# Patient Record
Sex: Male | Born: 1970 | Race: Asian | Hispanic: No | Marital: Married | Smoking: Never smoker
Health system: Southern US, Community
[De-identification: ages and names within clinical notes are randomized; demographics above are authoritative.]

---

## 2012-12-06 ENCOUNTER — Ambulatory Visit (INDEPENDENT_AMBULATORY_CARE_PROVIDER_SITE_OTHER): Payer: BC Managed Care – PPO | Admitting: Internal Medicine

## 2012-12-06 VITALS — BP 116/72 | HR 73 | Temp 98.2°F | Resp 18 | Ht 66.5 in | Wt 178.0 lb

## 2012-12-06 DIAGNOSIS — S86111A Strain of other muscle(s) and tendon(s) of posterior muscle group at lower leg level, right leg, initial encounter: Secondary | ICD-10-CM

## 2012-12-06 DIAGNOSIS — M79661 Pain in right lower leg: Secondary | ICD-10-CM

## 2012-12-06 DIAGNOSIS — M79609 Pain in unspecified limb: Secondary | ICD-10-CM

## 2012-12-06 DIAGNOSIS — S838X9A Sprain of other specified parts of unspecified knee, initial encounter: Secondary | ICD-10-CM

## 2012-12-06 MED ORDER — IBUPROFEN 800 MG PO TABS: 800.0000 mg | ORAL_TABLET | Freq: Three times a day (TID) | ORAL | Status: DC | PRN

## 2012-12-06 NOTE — Patient Instructions (Signed)
Recommend ice for 30 minutes 2-3 times daily Elevate as much as possible Take ibuprofen 800 mg three times daily as needed for pain Return immediately or go to ER if you feel numbness, tingling, or lose sensation in your foot

## 2012-12-06 NOTE — Progress Notes (Signed)
  Subjective:    Patient ID: Erik Porter, male    DOB: February 22, 1970, 42 y.o.   MRN: [id number]  HPI 42 year old male presents for evaluation of right leg pain after an injury this morning while playing in a bad mitten tournament.  States it was cold outside this morning and he did not warm up as much as he normally does. During the first play of the match he made a sudden move and felt and pop and since then has been unable to walk without pain. He was not able to complete the tournament so he came home, soaked his leg in salt water. This did not seem to help his sx's so he came in for evaluation.  No hx of prior injury or surgeries to this leg. Is able to move his foot. Denies paresthesias or weakness.  There is swelling of his calf but no bruising or other skin changes.  He has not taken any OTC medications for this yet.   Patient is otherwise heathy with no other concerns today.     Review of Systems  Gastrointestinal: Negative for nausea and vomiting.  Musculoskeletal: Positive for gait problem (limping) and myalgias.  Skin: Negative for color change and wound.  Neurological: Negative for dizziness.       Objective:   Physical Exam  Constitutional: He is oriented to person, place, and time. He appears well-developed and well-nourished.  HENT:  Head: Normocephalic and atraumatic.  Right Ear: External ear normal.  Left Ear: External ear normal.  Eyes: Conjunctivae are normal.  Neck: Normal range of motion.  Cardiovascular: Normal rate.   Pulmonary/Chest: Effort normal.  Musculoskeletal:       Right lower leg: He exhibits tenderness (center of gastrocnemius +TTP ) and swelling. He exhibits no bony tenderness.  Calf muscle right>left. Normal sensation and distal pulses.  Negative Thompson test.   Neurological: He is alert and oriented to person, place, and time.  Psychiatric: He has a normal mood and affect. His behavior is normal. Judgment and thought content normal.           Assessment & Plan:  Calf pain, right  Crutches for ambulation.  Ice for 30 minutes 2-3 times daily. Elevate as much as possible.  RTC precautions discussed. Signs/symptoms of compartment syndrome also mentioned.  Desk duty only until follow up in 2 weeks. At that time will determine if ok to ease back into activity or if PT is needed. Ok to refer to PT via telephone call update    I have completed the patient encounter in its entirety as documented by [facility], with editing by me where necessary. Robert P. Merla Riches, M.D.

## 2012-12-20 ENCOUNTER — Ambulatory Visit (INDEPENDENT_AMBULATORY_CARE_PROVIDER_SITE_OTHER): Payer: BC Managed Care – PPO | Admitting: Physician Assistant

## 2012-12-20 VITALS — BP 94/68 | HR 72 | Temp 98.7°F | Resp 18 | Ht 66.0 in | Wt 173.2 lb

## 2012-12-20 DIAGNOSIS — M79609 Pain in unspecified limb: Secondary | ICD-10-CM

## 2012-12-20 DIAGNOSIS — M79661 Pain in right lower leg: Secondary | ICD-10-CM

## 2012-12-20 NOTE — Progress Notes (Signed)
  Subjective:    Patient ID: Erik Porter, male    DOB: [birth year], 42 y.o.   MRN: [id number]  HPI 42 year old male presents for recheck of right calf pain. DOI 12/06/12 while playing badmitten.  Has been using crutches until 2 days ago.  Is tolerating weight bearing ok - still limping slightly but has returned to normal duties at work.  Continues to have pain if he is on his feet for too long. Has not taken any pain medication. Is interested in PT to help him get back to his normal level of activity.   Patient is otherwise doing well with no other concerns today.     Review of Systems  Constitutional: Negative for fever and chills.  Musculoskeletal: Positive for gait problem.  Skin: Negative for rash and wound.  Neurological: Negative for weakness and numbness.       Objective:   Physical Exam  Constitutional: He is oriented to person, place, and time. He appears well-developed and well-nourished.  HENT:  Head: Normocephalic and atraumatic.  Right Ear: External ear normal.  Left Ear: External ear normal.  Eyes: Conjunctivae are normal.  Neck: Normal range of motion.  Cardiovascular: Normal rate.   Pulmonary/Chest: Effort normal.  Musculoskeletal:       Right ankle: He exhibits normal range of motion. Achilles tendon normal. Achilles tendon exhibits normal Thompson's test results.       Right lower leg: He exhibits swelling (still slight STS and minimal pain to palpation). He exhibits no bony tenderness, no edema and no deformity.  Neurological: He is alert and oriented to person, place, and time.  Psychiatric: He has a normal mood and affect. His behavior is normal. Judgment and thought content normal.          Assessment & Plan:  Calf pain, right - Plan: Ambulatory referral to Physical Therapy  Will refer to PT for evaluation and treatment Follow up here if symptoms worsening or as needed.

## 2012-12-27 ENCOUNTER — Telehealth: Payer: Self-pay

## 2012-12-27 NOTE — Telephone Encounter (Signed)
Pt need doctor note for work, for last week visit

## 2012-12-29 NOTE — Telephone Encounter (Signed)
Called and left message on [phone] for patient to return call to give [place] more info in regards to this and what days specifically were needed.

## 2013-01-02 NOTE — Telephone Encounter (Signed)
Left a message for patient to return call.

## 2013-11-04 ENCOUNTER — Ambulatory Visit (INDEPENDENT_AMBULATORY_CARE_PROVIDER_SITE_OTHER): Payer: Managed Care, Other (non HMO) | Admitting: Emergency Medicine

## 2013-11-04 ENCOUNTER — Ambulatory Visit (INDEPENDENT_AMBULATORY_CARE_PROVIDER_SITE_OTHER): Payer: Managed Care, Other (non HMO)

## 2013-11-04 VITALS — BP 108/62 | HR 70 | Temp 98.3°F | Resp 16 | Ht 66.0 in | Wt 173.0 lb

## 2013-11-04 DIAGNOSIS — M25561 Pain in right knee: Secondary | ICD-10-CM

## 2013-11-04 DIAGNOSIS — S86811A Strain of other muscle(s) and tendon(s) at lower leg level, right leg, initial encounter: Secondary | ICD-10-CM

## 2013-11-04 DIAGNOSIS — S86911A Strain of unspecified muscle(s) and tendon(s) at lower leg level, right leg, initial encounter: Secondary | ICD-10-CM

## 2013-11-04 MED ORDER — NAPROXEN SODIUM 550 MG PO TABS
550.0000 mg | ORAL_TABLET | Freq: Two times a day (BID) | ORAL | Status: AC
Start: 2013-11-04 — End: 2014-11-04

## 2013-11-04 NOTE — Patient Instructions (Signed)

## 2013-11-04 NOTE — Progress Notes (Signed)
[facility] [address], [place] [phone] [phone]  Date:  11/04/2013   Name:  Erik LevinsMichel BMWUXLKGMWNUUVNhouyvanisvong   DOB:  02-01-1970   MRN:  [id number]  PCP:  No PCP Per Patient    Chief Complaint: Knee Pain   History of Present Illness:  Erik DarnerMichel Porter is a 43 y.o. very pleasant male patient who presents with the following:  Has pain in right knee over past week since playing competitive badminton.  No discrete injury Has increased pain with kneeling and bending.  Left work early today due to pain Some mild effusion. No redness. No improvement with over the counter medications or other home remedies.  Denies other complaint or health concern today.   There are no active problems to display for this patient.   History reviewed. No pertinent past medical history.  History reviewed. No pertinent past surgical history.  History  Substance Use Topics  . Smoking status: Never Smoker   . Smokeless tobacco: Never Used  . Alcohol Use: No    Family History  Problem Relation Age of Onset  . Hyperlipidemia Father     Allergies  Allergen Reactions  . Asa [Aspirin]   . Penicillins     Medication list has been reviewed and updated.  Current Outpatient Prescriptions on File Prior to Visit  Medication Sig Dispense Refill  . ibuprofen (ADVIL,MOTRIN) 800 MG tablet Take 1 tablet (800 mg total) by mouth every 8 (eight) hours as needed.  90 tablet  1   No current facility-administered medications on file prior to visit.    Review of Systems:  As per HPI, otherwise negative.    Physical Examination: Filed Vitals:   11/04/13 1809  BP: 108/62  Pulse: 70  Temp: 98.3 F (36.8 C)  Resp: 16   Filed Vitals:   11/04/13 1809  Height: 5\' 6"  (1.676 m)  Weight: 173 lb (78.472 kg)   Body mass index is 27.94 kg/(m^2). Ideal Body Weight: Weight in (lb) to have BMI = 25: 154.6   GEN: WDWN, NAD, Non-toxic, Alert & Oriented x 3 HEENT: Atraumatic,  Normocephalic.  Ears and Nose: No external deformity. EXTR: No clubbing/cyanosis/edema NEURO: Normal gait.  PSYCH: Normally interactive. Conversant. Not depressed or anxious appearing.  Calm demeanor.  RIGHT knee: mild effusion.  Full AROM.  Joint stable.    Assessment and Plan: Knee strain Anaprox ICE  Signed,  Erik OdorJeffery Yarlin Breisch, MD   [facility] reading (PRIMARY) by  Dr. Dareen PianoAnderson.  Negative knee.

## 2015-05-19 IMAGING — CR DG KNEE COMPLETE 4+V*R*
3 series · 3 of 3 positions shown · non-contrast
Comparison: None.

CLINICAL DATA: Right knee injury 1 day ago with continued pain.

EXAM:
RIGHT KNEE - COMPLETE 4+ VIEW

[AP]
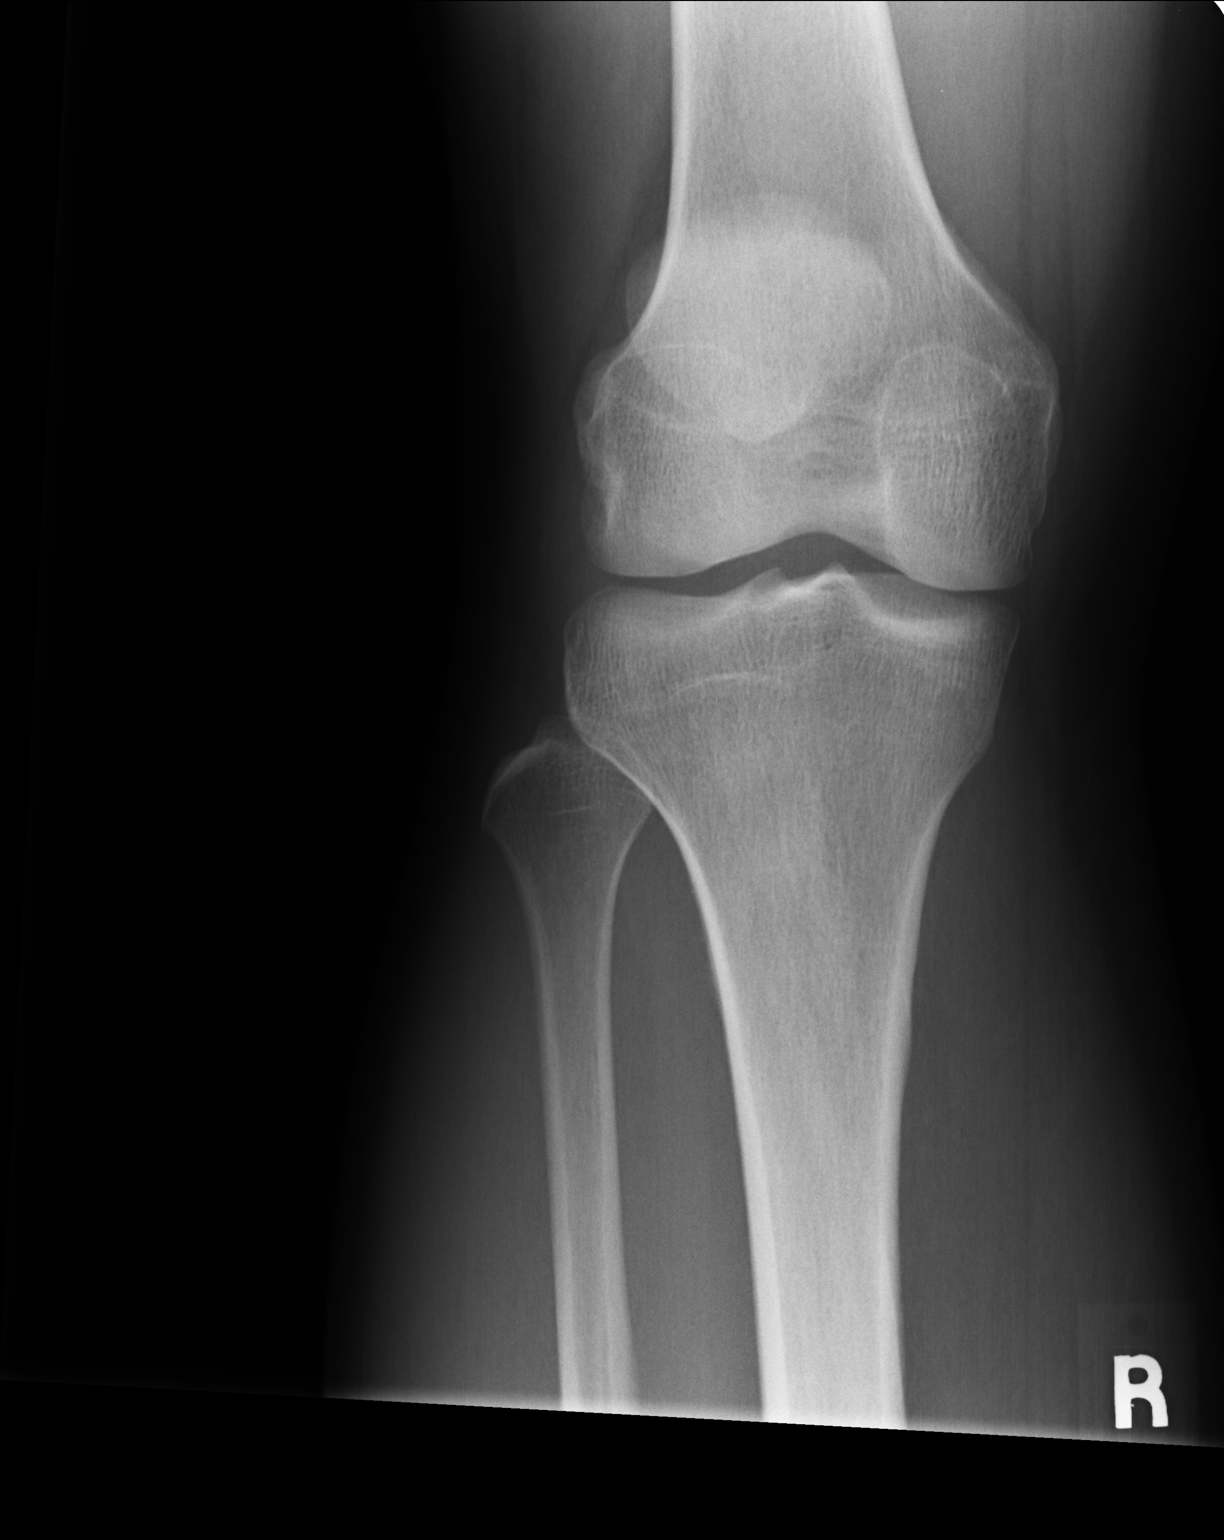

[ap int rot]
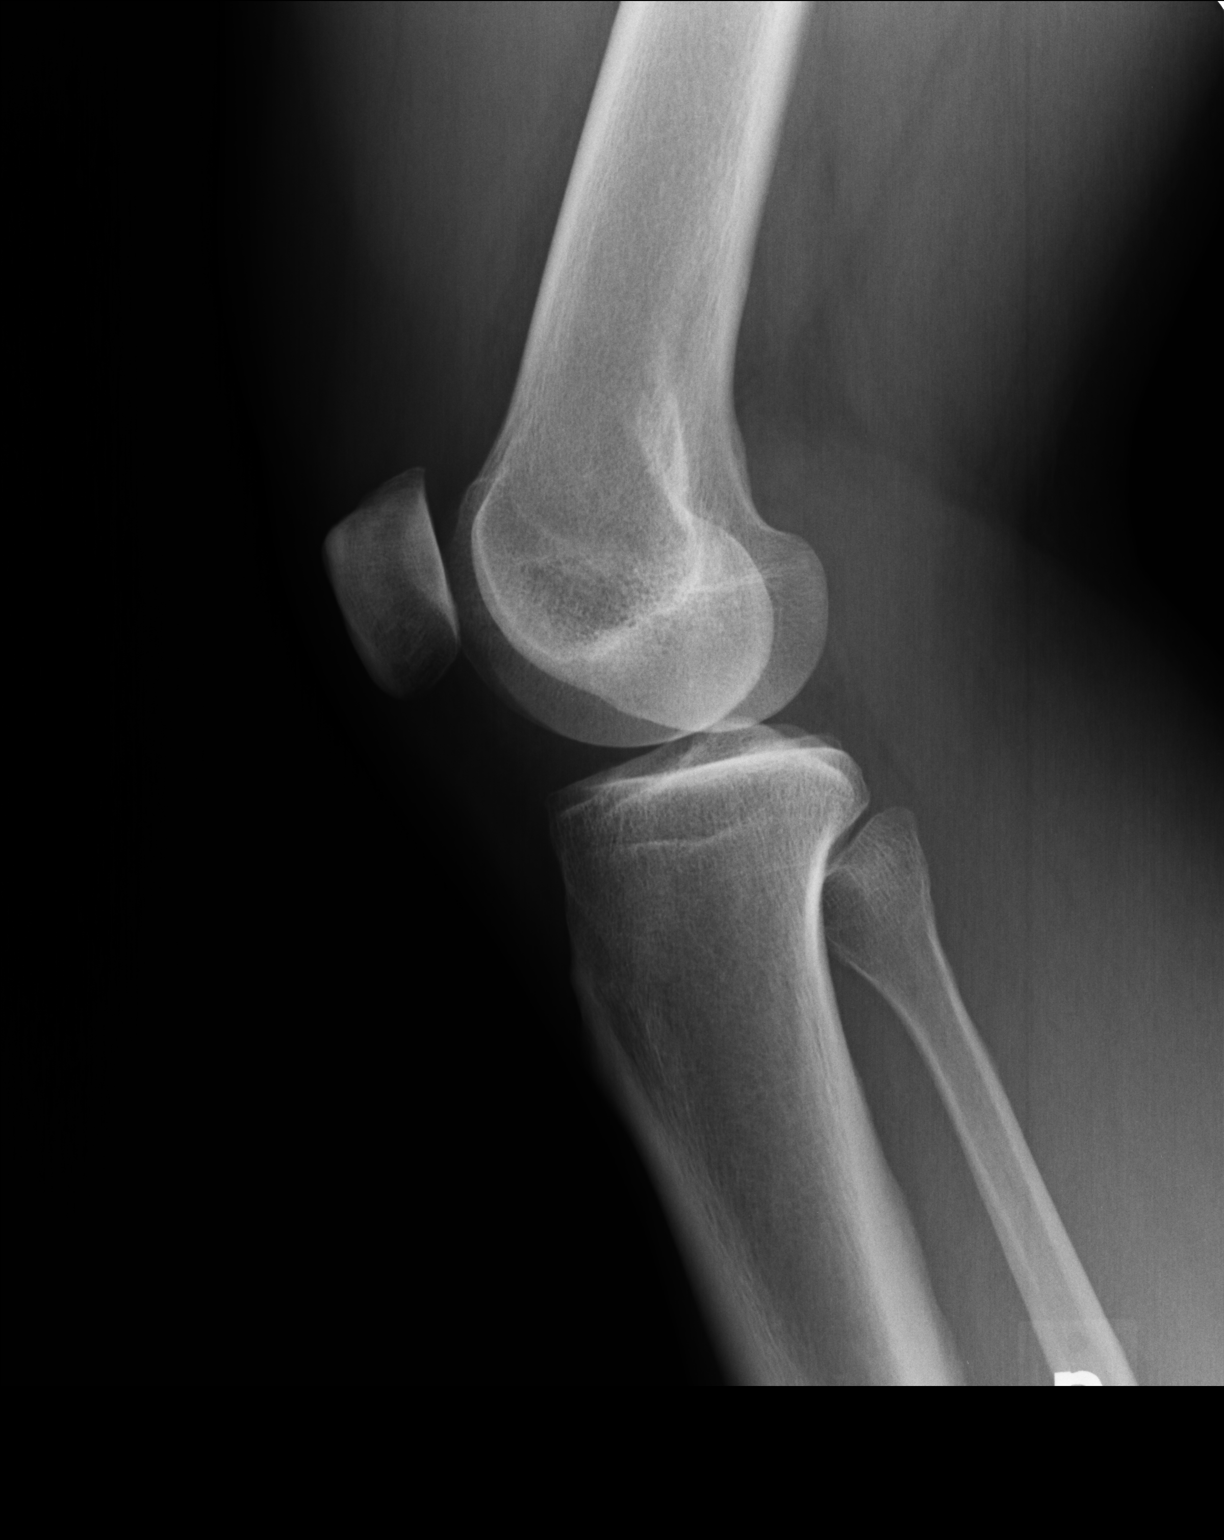

[lateral]
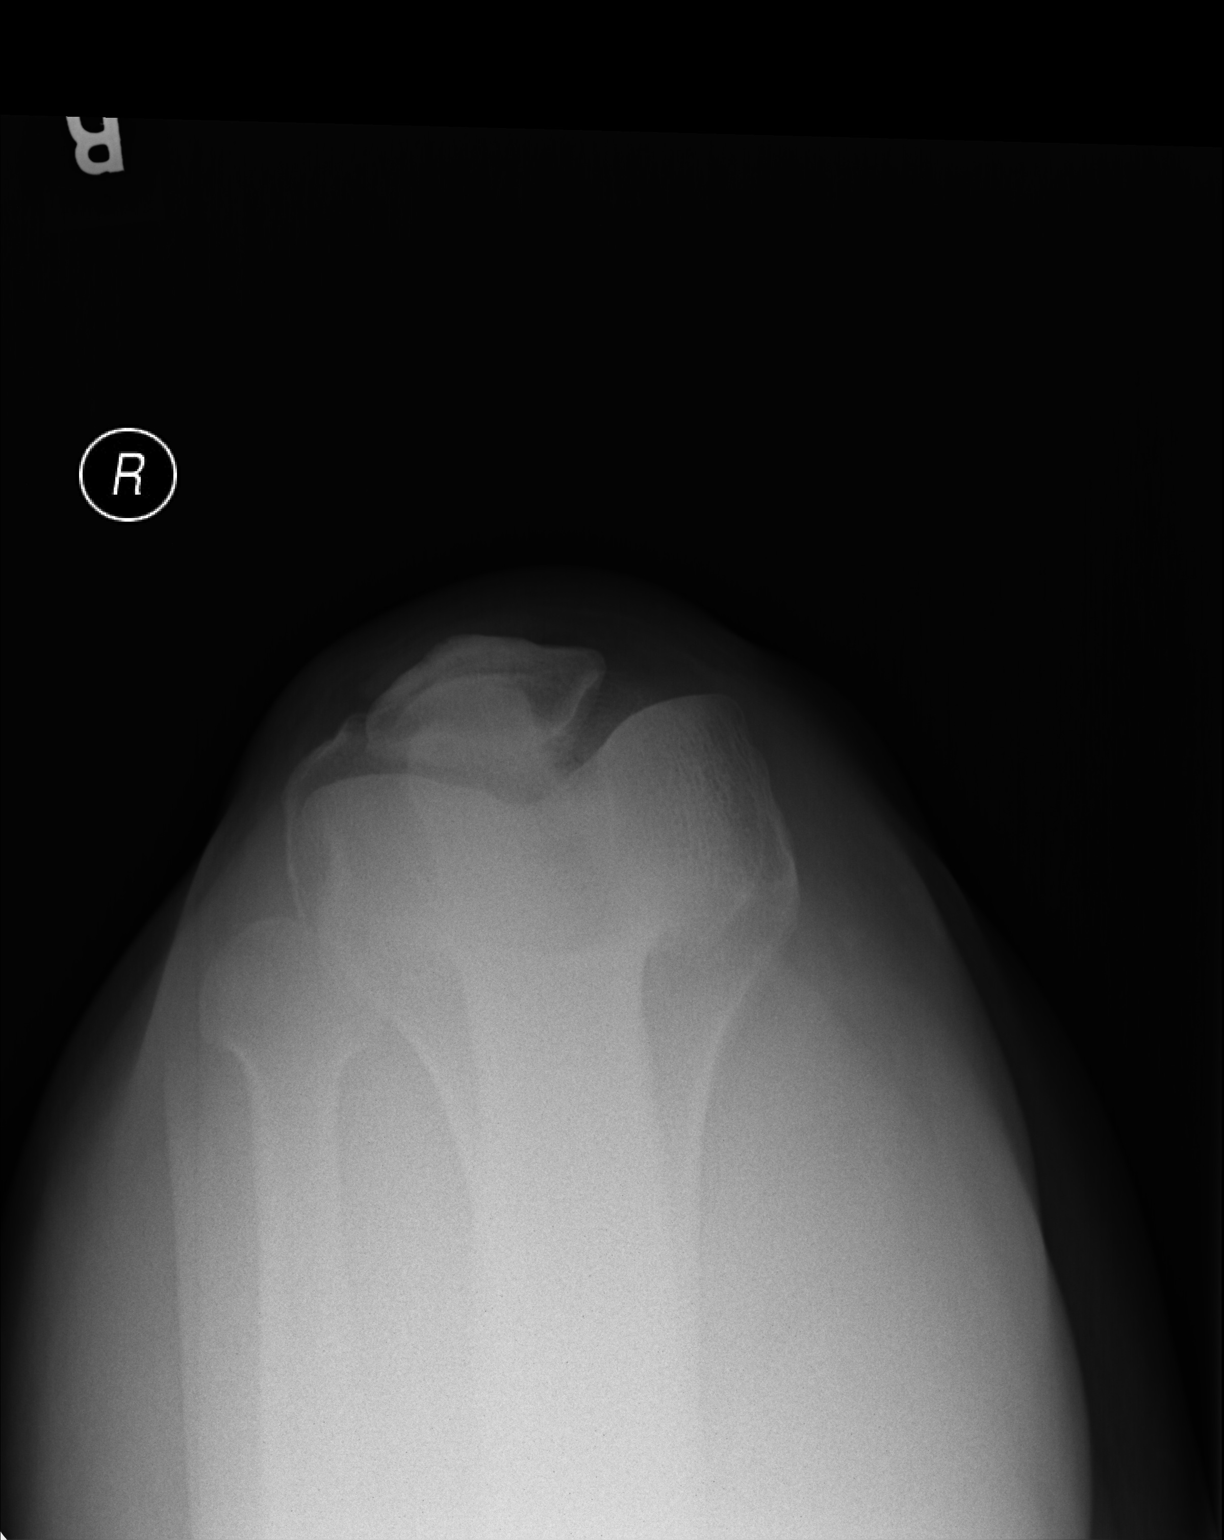

[3 of 3 positions shown; findings below may reference images not displayed]

FINDINGS: Imaged bones, joints and soft tissues appear normal.
IMPRESSION: Normal exam.

## 2018-03-31 ENCOUNTER — Ambulatory Visit (INDEPENDENT_AMBULATORY_CARE_PROVIDER_SITE_OTHER): Payer: Managed Care, Other (non HMO) | Admitting: Family Medicine

## 2018-03-31 ENCOUNTER — Encounter: Payer: Self-pay | Admitting: Family Medicine

## 2018-03-31 ENCOUNTER — Other Ambulatory Visit: Payer: Self-pay

## 2018-03-31 VITALS — BP 131/79 | HR 96 | Temp 98.8°F | Resp 18 | Ht 66.61 in | Wt 191.2 lb

## 2018-03-31 DIAGNOSIS — Z13220 Encounter for screening for lipoid disorders: Secondary | ICD-10-CM | POA: Diagnosis not present

## 2018-03-31 DIAGNOSIS — Z0001 Encounter for general adult medical examination with abnormal findings: Secondary | ICD-10-CM

## 2018-03-31 DIAGNOSIS — Z6830 Body mass index (BMI) 30.0-30.9, adult: Secondary | ICD-10-CM

## 2018-03-31 NOTE — Patient Instructions (Addendum)

## 2018-03-31 NOTE — Progress Notes (Signed)
Patient ID: Erik Porter, male   DOB: 01/05/1971, 48 y.o.   MRN: [id number]  Chief Complaint  Patient presents with  . Annual Exam  . Establish Care    Subjective:  Erik Porter is a 48 y.o. male here for a health maintenance visit.  Patient is new pt  There are no active problems to display for this patient.   History reviewed. No pertinent past medical history.  History reviewed. No pertinent surgical history.   Outpatient Medications Prior to Visit  Medication Sig Dispense Refill  . ibuprofen (ADVIL,MOTRIN) 800 MG tablet Take 1 tablet (800 mg total) by mouth every 8 (eight) hours as needed. (Patient not taking: Reported on 03/31/2018) 90 tablet 1   No facility-administered medications prior to visit.     Allergies  Allergen Reactions  . Asa [Aspirin]   . Penicillins      Family History  Problem Relation Age of Onset  . Hyperlipidemia Father      Health Habits: Dental Exam: up to date Eye Exam: up to date Exercise: 2 times/week on average Current exercise activities: [place] Diet: balanced  Social History   Socioeconomic History  . Marital status: Married    Spouse name: Not on file  . Number of children: 3  . Years of education: Not on file  . Highest education level: Not on file  Occupational History  . Not on file  Social Needs  . Financial resource strain: Not on file  . Food insecurity:    Worry: Not on file    Inability: Not on file  . Transportation needs:    Medical: Not on file    Non-medical: Not on file  Tobacco Use  . Smoking status: Never Smoker  . Smokeless tobacco: Never Used  Substance and Sexual Activity  . Alcohol use: No  . Drug use: No  . Sexual activity: Yes  Lifestyle  . Physical activity:    Days per week: Not on file    Minutes per session: Not on file  . Stress: Not on file  Relationships  . Social connections:    Talks on phone: Not on file    Gets together: Not on file    Attends religious  service: Not on file    Active member of club or organization: Not on file    Attends meetings of clubs or organizations: Not on file    Relationship status: Not on file  . Intimate partner violence:    Fear of current or ex partner: Not on file    Emotionally abused: Not on file    Physically abused: Not on file    Forced sexual activity: Not on file  Other Topics Concern  . Not on file  Social History Narrative  . Not on file   Social History   Substance and Sexual Activity  Alcohol Use No   Social History   Tobacco Use  Smoking Status Never Smoker  Smokeless Tobacco Never Used   Social History   Substance and Sexual Activity  Drug Use No     Health Maintenance: See under health Maintenance activity for review of completion dates as well.  There is no immunization history on file for this patient.    Depression Screen-PHQ2/9 Depression screen [facility] 2/9 03/31/2018  Decreased Interest 0  Down, Depressed, Hopeless 0  PHQ - 2 Score 0       Depression Severity and Treatment Recommendations:  0-4= None  5-9= Mild / Treatment: Support, educate to  call if worse; return in one month  10-14= Moderate / Treatment: Support, watchful waiting; Antidepressant or Psycotherapy  15-19= Moderately severe / Treatment: Antidepressant OR Psychotherapy  >= 20 = Major depression, severe / Antidepressant AND Psychotherapy    Review of Systems   ROS  See HPI for ROS as well.  Review of Systems  Constitutional: Negative for activity change, appetite change, chills and fever.  HENT: Negative for congestion, nosebleeds, trouble swallowing and voice change.   Respiratory: Negative for cough, shortness of breath and wheezing.   Gastrointestinal: Negative for diarrhea, nausea and vomiting.  Genitourinary: Negative for difficulty urinating, dysuria, flank pain and hematuria.  Musculoskeletal: Negative for back pain, joint swelling and neck pain.  Neurological: Negative for  dizziness, speech difficulty, light-headedness and numbness.  See HPI. All other review of systems negative.    Objective:   Vitals:   03/31/18 1529  BP: 131/79  Pulse: 96  Resp: 18  Temp: 98.8 F (37.1 C)  TempSrc: Oral  SpO2: 96%  Weight: 191 lb 3.2 oz (86.7 kg)  Height: 5' 6.61" (1.692 m)    Body mass index is 30.29 kg/m.  Physical Exam  Physical Exam  Constitutional: Oriented to person, place, and time. Appears well-developed and well-nourished.  HENT:  Head: Normocephalic and atraumatic.  Eyes: Conjunctivae and EOM are normal.  Ears: normal TM, normal canal bilaterally Cardiovascular: Normal rate, regular rhythm, normal heart sounds and intact distal pulses.  No murmur heard. Pulmonary/Chest: Effort normal and breath sounds normal. No stridor. No respiratory distress. Has no wheezes.  Abdomen: normoactive bs, soft, nontender, no rebound or guarding Neurological: Is alert and oriented to person, place, and time.  Skin: Skin is warm. Capillary refill takes less than 2 seconds.  Psychiatric: Has a normal mood and affect. Behavior is normal. Judgment and thought content normal.     Assessment/Plan:   Patient was seen for a health maintenance exam.  Counseled the patient on health maintenance issues. Reviewed her health mainteance schedule and ordered appropriate tests (see orders.) Counseled on regular exercise and weight management. Recommend regular eye exams and dental cleaning.   The following issues were addressed today for health maintenance:   Erik Porter was seen today for annual exam and establish care.  Diagnoses and all orders for this visit:  Encounter for general adult medical examination with abnormal findings-  Discussed age appropriate screenings  Screening for lipoid disorders-  Discussed lipid goals -     Lipid panel -     TSH  Body mass index (BMI) of 30.0-30.9 in adult -     Basic metabolic panel -     Hemoglobin A1c    Return in 1 year  (on 03/31/2019).    Body mass index is 30.29 kg/m.:  Discussed the patient's BMI with patient. The BMI body mass index is 30.29 kg/m.     No future appointments.  Patient Instructions       If you have lab work done today you will be contacted with your lab results within the next 2 weeks.  If you have not heard from [place] then please contact us. The fastest way to get your results is to register for My Chart.   IF you received an x-ray today, you will receive an invoice from [facility]. Please contact [facility] at [phone] with questions or concerns regarding your invoice.   IF you received labwork today, you will receive an invoice from [facility]. Please contact [facility] at [phone] with questions  or concerns regarding your invoice.   Our billing staff will not be able to assist you with questions regarding bills from these companies.  You will be contacted with the lab results as soon as they are available. The fastest way to get your results is to activate your My Chart account. Instructions are located on the last page of this paperwork. If you have not heard from [place] regarding the results in 2 weeks, please contact this office.      Health Maintenance, Male A healthy lifestyle and preventive care is important for your health and wellness. Ask your health care provider about what schedule of regular examinations is right for you. What should I know about weight and diet? Eat a Healthy Diet  Eat plenty of vegetables, fruits, whole grains, low-fat dairy products, and lean protein.  Do not eat a lot of foods high in solid fats, added sugars, or salt.  Maintain a Healthy Weight Regular exercise can help you achieve or maintain a healthy weight. You should:  Do at least 150 minutes of exercise each week. The exercise should increase your heart rate and make you sweat (moderate-intensity exercise).  Do strength-training exercises at least twice a  week. Watch Your Levels of Cholesterol and Blood Lipids  Have your blood tested for lipids and cholesterol every 5 years starting at 49 years of age. If you are at high risk for heart disease, you should start having your blood tested when you are 49 years old. You may need to have your cholesterol levels checked more often if: ? Your lipid or cholesterol levels are high. ? You are older than 49 years of age. ? You are at high risk for heart disease. What should I know about cancer screening? Many types of cancers can be detected early and may often be prevented. Lung Cancer  You should be screened every year for lung cancer if: ? You are a current smoker who has smoked for at least 30 years. ? You are a former smoker who has quit within the past 15 years.  Talk to your health care provider about your screening options, when you should start screening, and how often you should be screened. Colorectal Cancer  Routine colorectal cancer screening usually begins at 49 years of age and should be repeated every 5-10 years until you are 49 years old. You may need to be screened more often if early forms of precancerous polyps or small growths are found. Your health care provider may recommend screening at an earlier age if you have risk factors for colon cancer.  Your health care provider may recommend using home test kits to check for hidden blood in the stool.  A small camera at the end of a tube can be used to examine your colon (sigmoidoscopy or colonoscopy). This checks for the earliest forms of colorectal cancer. Prostate and Testicular Cancer  Depending on your age and overall health, your health care provider may do certain tests to screen for prostate and testicular cancer.  Talk to your health care provider about any symptoms or concerns you have about testicular or prostate cancer. Skin Cancer  Check your skin from head to toe regularly.  Tell your health care provider about any  new moles or changes in moles, especially if: ? There is a change in a mole's size, shape, or color. ? You have a mole that is larger than a pencil eraser.  Always use sunscreen. Apply sunscreen liberally and repeat throughout  the day.  Protect yourself by wearing long sleeves, pants, a wide-brimmed hat, and sunglasses when outside. What should I know about heart disease, diabetes, and high blood pressure?  If you are 4818-49 years of age, have your blood pressure checked every 3-5 years. If you are 49 years of age or older, have your blood pressure checked every year. You should have your blood pressure measured twice-once when you are at a hospital or clinic, and once when you are not at a hospital or clinic. Record the average of the two measurements. To check your blood pressure when you are not at a hospital or clinic, you can use: ? An automated blood pressure machine at a pharmacy. ? A home blood pressure monitor.  Talk to your health care provider about your target blood pressure.  If you are between 6045-49 years old, ask your health care provider if you should take aspirin to prevent heart disease.  Have regular diabetes screenings by checking your fasting blood sugar level. ? If you are at a normal weight and have a low risk for diabetes, have this test once every three years after the age of 49. ? If you are overweight and have a high risk for diabetes, consider being tested at a younger age or more often.  A one-time screening for abdominal aortic aneurysm (AAA) by ultrasound is recommended for men aged 65-75 years who are current or former smokers. What should I know about preventing infection? Hepatitis B If you have a higher risk for hepatitis B, you should be screened for this virus. Talk with your health care provider to find out if you are at risk for hepatitis B infection. Hepatitis C Blood testing is recommended for:  Everyone born from [id number] through 1966.  Anyone with  known risk factors for hepatitis C. Sexually Transmitted Diseases (STDs)  You should be screened each year for STDs including gonorrhea and chlamydia if: ? You are sexually active and are younger than 49 years of age. ? You are older than 49 years of age and your health care provider tells you that you are at risk for this type of infection. ? Your sexual activity has changed since you were last screened and you are at an increased risk for chlamydia or gonorrhea. Ask your health care provider if you are at risk.  Talk with your health care provider about whether you are at high risk of being infected with HIV. Your health care provider may recommend a prescription medicine to help prevent HIV infection. What else can I do?  Schedule regular health, dental, and eye exams.  Stay current with your vaccines (immunizations).  Do not use any tobacco products, such as cigarettes, chewing tobacco, and e-cigarettes. If you need help quitting, ask your health care provider.  Limit alcohol intake to no more than 2 drinks per day. One drink equals 12 ounces of beer, 5 ounces of wine, or 1 ounces of hard liquor.  Do not use street drugs.  Do not share needles.  Ask your health care provider for help if you need support or information about quitting drugs.  Tell your health care provider if you often feel depressed.  Tell your health care provider if you have ever been abused or do not feel safe at home. This information is not intended to replace advice given to you by your health care provider. Make sure you discuss any questions you have with your health care provider. Document Released: 07/14/2007 Document Revised:  09/14/2015 Document Reviewed: 10/19/2014 Elsevier Interactive Patient Education  [employer].

## 2018-04-01 LAB — BASIC METABOLIC PANEL
BUN/Creatinine Ratio: 13 (ref 9–20)
BUN: 14 mg/dL (ref 6–24)
CALCIUM: 9.8 mg/dL (ref 8.7–10.2)
CO2: 24 mmol/L (ref 20–29)
Chloride: 102 mmol/L (ref 96–106)
Creatinine, Ser: 1.12 mg/dL (ref 0.76–1.27)
GFR calc Af Amer: 89 mL/min/{1.73_m2} (ref >59)
GFR, EST NON AFRICAN AMERICAN: 77 mL/min/{1.73_m2} (ref >59)
Glucose: 99 mg/dL (ref 65–99)
Potassium: 4 mmol/L (ref 3.5–5.2)
Sodium: 140 mmol/L (ref 134–144)

## 2018-04-01 LAB — LIPID PANEL
CHOLESTEROL TOTAL: 231 mg/dL — AB (ref 100–199)
Chol/HDL Ratio: 5 ratio (ref 0.0–5.0)
HDL: 46 mg/dL (ref >39)
LDL Calculated: 153 mg/dL — ABNORMAL HIGH (ref 0–99)
TRIGLYCERIDES: 159 mg/dL — AB (ref 0–149)
VLDL Cholesterol Cal: 32 mg/dL (ref 5–40)

## 2018-04-01 LAB — TSH: TSH: 2.15 u[IU]/mL (ref 0.450–4.500)

## 2018-04-01 LAB — HEMOGLOBIN A1C
ESTIMATED AVERAGE GLUCOSE: 120 mg/dL
Hgb A1c MFr Bld: 5.8 % — ABNORMAL HIGH (ref 4.8–5.6)

## 2020-02-11 ENCOUNTER — Ambulatory Visit: Payer: Self-pay

## 2023-05-01 ENCOUNTER — Telehealth: Payer: Self-pay | Admitting: Family Medicine

## 2023-05-01 ENCOUNTER — Ambulatory Visit (INDEPENDENT_AMBULATORY_CARE_PROVIDER_SITE_OTHER): Admitting: Radiology

## 2023-05-01 ENCOUNTER — Encounter: Payer: Self-pay | Admitting: Emergency Medicine

## 2023-05-01 ENCOUNTER — Ambulatory Visit
Admission: EM | Admit: 2023-05-01 | Discharge: 2023-05-01 | Disposition: A | Discharge status: Home / Self Care | Attending: Internal Medicine | Admitting: Internal Medicine

## 2023-05-01 ENCOUNTER — Ambulatory Visit: Payer: Self-pay

## 2023-05-01 DIAGNOSIS — M19071 Primary osteoarthritis, right ankle and foot: Secondary | ICD-10-CM | POA: Diagnosis not present

## 2023-05-01 DIAGNOSIS — M79671 Pain in right foot: Secondary | ICD-10-CM | POA: Diagnosis not present

## 2023-05-01 DIAGNOSIS — M7989 Other specified soft tissue disorders: Secondary | ICD-10-CM | POA: Diagnosis not present

## 2023-05-01 DIAGNOSIS — M7731 Calcaneal spur, right foot: Secondary | ICD-10-CM | POA: Diagnosis not present

## 2023-05-01 NOTE — Discharge Instructions (Addendum)
 It is unclear what is causing the foot pain and swelling.  I have drawn a circle around the area of redness on your foot and would like for you to return to urgent care if the redness grows rapidly or if the area becomes warm to touch/more tender.   X-rays look great, no bony abnormalities.  Take tylenol as needed for pain.  Elevate the foot to relieve swelling.  Follow-up with [facility] as needed for ongoing evaluation.   If you develop any new or worsening symptoms or if your symptoms do not start to improve, please return here or follow-up with your primary care provider. If your symptoms are severe, please go to the emergency room.

## 2023-05-01 NOTE — ED Triage Notes (Addendum)
 Pt c/o right foot pain since yesterday. States he was driving back from [place] yesterday when foot began to hurt and has been very sore since. Foot is swollen, slightly red. Pain near middle toe and radiates from top to bottom of foot.     Pain yesterday 7/10 today 4/10 No ibuprofen or tylenol taken

## 2023-05-01 NOTE — Telephone Encounter (Signed)
 Noted. Pt scheduled With Family Medicine Garnette Gunner, MD) 06/10/2023 at 3:40 PM

## 2023-05-01 NOTE — ED Provider Notes (Addendum)
 [facility]    CSN: [id number] Arrival date & time: 05/01/23  1414      History   Chief Complaint Chief Complaint  Patient presents with   Foot Pain    HPI Erik Porter is a 53 y.o. Porter.   Erik Porter is a 53 y.o. Porter presenting for chief complaint of Foot Pain that started yesterday. Pain is located over the distal second and third metatarsals and started while he was driving home from [facility] yesterday. He was wearing tennis shoes during his drive and reports he started to feel the foot become more swollen inside of his shoe throughout the drive. Pain radiates through the foot from top to bottom and there is a small area of redness to the dorsum of the foot at site of pain. Denies recent trauma/injuries to the foot, numbness/tingling distally to pain/injury, and fevers/chills. No history of immunosuppression. The area of redness is not warm to touch and there are no puncture wounds or abrasions. Pain is triggered by weight bearing activity and fully resolves with rest/cessation of movement. He wonders if this could be due to tendonitis as he has had this in the past. He also states he suffered an injury to the same place 20 years ago when he was playing bad mitten more consistently and had to wear a custom insole for a while. No history of gout.  He has not attempted use of any OTC medicines to help with symptoms PTA.  Allergic to aspirin (caused fever to 110 degrees and severe headache per patient), he has taken ibuprofen in the past without reaction.    Foot Pain    History reviewed. No pertinent past medical history.  There are no active problems to display for this patient.   History reviewed. No pertinent surgical history.     Home Medications    Prior to Admission medications   Not on File    Family History Family History  Problem Relation Age of Onset   Hyperlipidemia Father     Social History Social History   Tobacco Use    Smoking status: Never   Smokeless tobacco: Never  Vaping Use   Vaping status: Never Used  Substance Use Topics   Alcohol use: No   Drug use: No     Allergies   Asa [aspirin] and Penicillins   Review of Systems Review of Systems Per HPI  Physical Exam Triage Vital Signs ED Triage Vitals  Encounter Vitals Group     BP 05/01/23 1427 121/82     Systolic BP Percentile --      Diastolic BP Percentile --      Pulse Rate 05/01/23 1427 80     Resp 05/01/23 1427 16     Temp 05/01/23 1427 97.8 F (Erik.6 C)     Temp Source 05/01/23 1427 Oral     SpO2 05/01/23 1427 93 %     Weight --      Height --      Head Circumference --      Peak Flow --      Pain Score 05/01/23 1429 4     Pain Loc --      Pain Education --      Exclude from Growth Chart --    No data found.  Updated Vital Signs BP 121/82 (BP Location: Right Arm)   Pulse 80   Temp 97.8 F (Erik.6 C) (Oral)   Resp 16   SpO2 93%   Visual  Acuity Right Eye Distance:   Left Eye Distance:   Bilateral Distance:    Right Eye Near:   Left Eye Near:    Bilateral Near:     Physical Exam Vitals and nursing note reviewed.  Constitutional:      Appearance: He is not ill-appearing or toxic-appearing.  HENT:     Head: Normocephalic and atraumatic.     Right Ear: Hearing and external ear normal.     Left Ear: Hearing and external ear normal.     Nose: Nose normal.     Mouth/Throat:     Lips: Pink.  Eyes:     General: Lids are normal. Vision grossly intact. Gaze aligned appropriately.     Extraocular Movements: Extraocular movements intact.     Conjunctiva/sclera: Conjunctivae normal.  Pulmonary:     Effort: Pulmonary effort is normal.  Musculoskeletal:     Cervical back: Neck supple.     Right foot: Normal range of motion and normal capillary refill. Tenderness present. No swelling, deformity, bunion, Charcot foot, foot drop, prominent metatarsal heads, laceration, bony tenderness or crepitus. Normal pulse.     Left  foot: Normal.       Feet:     Comments: +2 bilateral dorsalis pedis pulses. Normal ROM of the right foot. Tender to palpation over the distal second and third metatarsals. 3cm in diameter area of erythema overlying area of tenderness. No lacerations/abrasions/wounds. No interdigital lesions. Ambulatory with steady gait without limp.   Skin:    General: Skin is warm and dry.     Capillary Refill: Capillary refill takes less than 2 seconds.     Findings: No rash.  Neurological:     General: No focal deficit present.     Mental Status: He is alert and oriented to person, place, and time. Mental status is at baseline.     Cranial Nerves: No dysarthria or facial asymmetry.  Psychiatric:        Mood and Affect: Mood normal.        Speech: Speech normal.        Behavior: Behavior normal.        Thought Content: Thought content normal.        Judgment: Judgment normal.      UC Treatments / Results  Labs (all labs ordered are listed, but only abnormal results are displayed) Labs Reviewed - No data to display  EKG   Radiology No results found.  Procedures Procedures (including critical care time)  Medications Ordered in UC Medications - No data to display  Initial Impression / Assessment and Plan / UC Course  I have reviewed the triage vital signs and the nursing notes.  Pertinent labs & imaging results that were available during my care of the patient were reviewed by me and considered in my medical decision making (see chart for details).   1. Acute right foot pain Unclear etiology of patient's right foot pain and swelling/redness. This could be an early presentation of developing cellulitis, though exam is not very convincing today as there is no associated warmth to redness. No lymphangitis. Minimally tender to palpation, mostly tender with weightbearing activity and movement. Low suspicion for gouty arthritis.   X-ray of right foot is unremarkable for signs of acute bony  abnormality, radiology re-read pending at time of discharge, staff will call if re-read indicates need for change in treatment plan.  For now, we will treat with antiinflammatories for tendinitis, RICE, and have patient follow-up with [facility] for ongoing evaluation.  Ibuprofen 600mg  every 6 hours as needed for pain/swelling with food.   Advised to return to urgent care if redness/swelling worsen in the next few days. Skin pen used to outline area of erythema.   Counseled patient on potential for adverse effects with medications prescribed/recommended today, strict ER and return-to-clinic precautions discussed, patient verbalized understanding.    Final Clinical Impressions(s) / UC Diagnoses   Final diagnoses:  Acute foot pain, right     Discharge Instructions      It is unclear what is causing the foot pain and swelling.  I have drawn a circle around the area of redness on your foot and would like for you to return to urgent care if the redness grows rapidly or if the area becomes warm to touch/more tender.   X-rays look great, no bony abnormalities.  Take tylenol as needed for pain.  Elevate the foot to relieve swelling.  Follow-up with [facility] as needed for ongoing evaluation.   If you develop any new or worsening symptoms or if your symptoms do not start to improve, please return here or follow-up with your primary care provider. If your symptoms are severe, please go to the emergency room.     ED Prescriptions   None    PDMP not reviewed this encounter.     Carlisle Beers, [place] 05/01/23 [phone]

## 2023-05-01 NOTE — Telephone Encounter (Signed)
 Copied from CRM [id number]. Topic: Clinical - Red Word Triage >> May 01, 2023  9:04 AM Sasha H wrote: [facility] that prompted transfer to Nurse Triage: PT states he started having discomfort in right foot last night while traveling, with waking up this morning and barely being able to bare weight on it.   Chief Complaint: Right Foot Symptoms: Pain, Swelling  Frequency: Acute  Pertinent Negatives: Patient denies fever, discoloration, numbness, weakness  Disposition: [] ED /[x] Urgent Care (no appt availability in office) / [x] Appointment(In office/virtual)/ []  [facility]/ [] Home Care/ [] Refused Recommended Disposition /[]  Mobile Bus/ []  Follow-up with PCP Additional Notes: MN is being triaged for pain in his right foot. The patient is without any numbness, tingling, or discoloration. The patient complains of severe pain when bearing weight on his right foot. Referred patient to the urgent care for prompt evaluation and treatment, made a new patient appointment at the patient's office of choice.   Reason for Disposition  [1] SEVERE pain (e.g., excruciating, unable to do any normal activities) AND [2] not improved after 2 hours of pain medicine  Answer Assessment - Initial Assessment Questions 1. ONSET: "When did the pain start?"      Acute, since last nigh  2. LOCATION: "Where is the pain located?"      Right Foot, Top of foot  3. PAIN: "How bad is the pain?"    (Scale 1-10; or mild, moderate, severe)  - MILD (1-3): doesn't interfere with normal activities.   - MODERATE (4-7): interferes with normal activities (e.g., work or school) or awakens from sleep, limping.   - SEVERE (8-10): excruciating pain, unable to do any normal activities, unable to walk.      7  4. WORK OR EXERCISE: "Has there been any recent work or exercise that involved this part of the body?"      No, only traveled for an extended period of time.  5. CAUSE: "What do you think is causing the foot  pain?"     Unsure  6. OTHER SYMPTOMS: "Do you have any other symptoms?" (e.g., leg pain, rash, fever, numbness)     Swelling  Protocols used: Foot Pain-A-AH

## 2023-05-04 ENCOUNTER — Ambulatory Visit: Admitting: Podiatry

## 2023-05-06 ENCOUNTER — Ambulatory Visit (INDEPENDENT_AMBULATORY_CARE_PROVIDER_SITE_OTHER)

## 2023-05-06 ENCOUNTER — Ambulatory Visit (INDEPENDENT_AMBULATORY_CARE_PROVIDER_SITE_OTHER): Admitting: Podiatry

## 2023-05-06 DIAGNOSIS — M79671 Pain in right foot: Secondary | ICD-10-CM

## 2023-05-06 DIAGNOSIS — M778 Other enthesopathies, not elsewhere classified: Secondary | ICD-10-CM

## 2023-05-08 NOTE — Progress Notes (Signed)
  Subjective:  Patient ID: Erik Porter, male    DOB: 1970/04/03,  MRN: [id number]  Chief Complaint  Patient presents with   Foot Pain    RM#11 Some discomfort with movement no injuries started on Tuesday night while traveling.    Discussed the use of AI scribe software for clinical note transcription with the patient, who gave verbal consent to proceed.  History of Present Illness The patient, with a history of a small fracture on the same toe 25 years ago, presents with right foot pain that started on Tuesday evening while driving back from [place]. The discomfort was localized to the top of the second toe (MTPJ) and was associated with swelling, making his shoe feel tight. The pain worsened over time, to the point where he had to remove his shoe to continue driving. The pain was severe enough to disrupt his sleep and he was unable to put his foot on the floor the following morning. The pain was exacerbated by movement and felt like tendonitis. The patient denies any pain in the calf or leg and denies any other swelling. He sought care at an urgent care center where he was advised to take Tylenol for the pain due to an allergy to aspirin, which causes high fever. The patient reports that the pain has since resolved and denies any current discomfort.      Objective:    Physical Exam General: AAO x3, NAD  Dermatological: Skin is warm, dry and supple bilateral. There are no open sores, no preulcerative lesions, no rash or signs of infection present.  Vascular: Dorsalis Pedis artery and Posterior Tibial artery pedal pulses are 2/4 bilateral with immedate capillary fill time. There is no pain with calf compression, swelling, warmth, erythema.   Neruologic: Grossly intact via light touch bilateral.  Musculoskeletal: Unable to appreciate any area of pinpoint tenderness.  There is an area marked off on the right dorsal foot on the second MTPJ where he previously had some localized  erythema white, able to visualize today.  There is trace edema there is no erythema or warmth.  There is no skin breakdown.  Not able to appreciate any area pinpoint tenderness.  Gait: Unassisted, Nonantalgic.     No images are attached to the encounter.    Results RADIOLOGY Foot X-ray: No fractures, joints appear normal, without any evidence of acute fracture.     Assessment:   1. Capsulitis of right foot      Plan:  Patient was evaluated and treated and all questions answered.  Assessment and Plan Assessment & Plan Capsulitis of the second toe Acute pain and swelling resolved. Minimal tenderness remains. Likely capsulitis due to positional strain. No fracture or infection. Erythema likely from inflammation.  - Advise icing 20 minutes twice daily. - Recommend over-the-counter Voltaren gel. - Suggest Biofreeze for relief. - Instruct to monitor symptoms and contact office if recurrence occurs, especially during long drives.    Return if symptoms worsen or fail to improve.   Vivi Barrack DPM

## 2023-06-10 ENCOUNTER — Ambulatory Visit: Payer: Self-pay | Admitting: Family Medicine

## 2023-06-10 ENCOUNTER — Ambulatory Visit: Admitting: Family Medicine

## 2023-07-24 ENCOUNTER — Ambulatory Visit: Payer: Self-pay | Admitting: Family Medicine

## 2023-07-24 ENCOUNTER — Encounter: Payer: Self-pay | Admitting: Family Medicine

## 2023-07-24 ENCOUNTER — Ambulatory Visit: Admitting: Family Medicine

## 2023-07-24 VITALS — BP 116/74 | HR 67 | Temp 97.9°F | Ht 66.25 in | Wt 203.2 lb

## 2023-07-24 DIAGNOSIS — E6609 Other obesity due to excess calories: Secondary | ICD-10-CM

## 2023-07-24 DIAGNOSIS — Z1159 Encounter for screening for other viral diseases: Secondary | ICD-10-CM

## 2023-07-24 DIAGNOSIS — E66811 Obesity, class 1: Secondary | ICD-10-CM

## 2023-07-24 DIAGNOSIS — Z125 Encounter for screening for malignant neoplasm of prostate: Secondary | ICD-10-CM | POA: Diagnosis not present

## 2023-07-24 DIAGNOSIS — Z0000 Encounter for general adult medical examination without abnormal findings: Secondary | ICD-10-CM | POA: Diagnosis not present

## 2023-07-24 DIAGNOSIS — E782 Mixed hyperlipidemia: Secondary | ICD-10-CM

## 2023-07-24 DIAGNOSIS — Z7689 Persons encountering health services in other specified circumstances: Secondary | ICD-10-CM

## 2023-07-24 DIAGNOSIS — R7303 Prediabetes: Secondary | ICD-10-CM

## 2023-07-24 DIAGNOSIS — Z1211 Encounter for screening for malignant neoplasm of colon: Secondary | ICD-10-CM

## 2023-07-24 DIAGNOSIS — Z6832 Body mass index (BMI) 32.0-32.9, adult: Secondary | ICD-10-CM | POA: Diagnosis not present

## 2023-07-24 DIAGNOSIS — Z1212 Encounter for screening for malignant neoplasm of rectum: Secondary | ICD-10-CM | POA: Diagnosis not present

## 2023-07-24 DIAGNOSIS — R3121 Asymptomatic microscopic hematuria: Secondary | ICD-10-CM

## 2023-07-24 DIAGNOSIS — R7401 Elevation of levels of liver transaminase levels: Secondary | ICD-10-CM

## 2023-07-24 LAB — CBC WITH DIFFERENTIAL/PLATELET
Basophils Absolute: 0.1 10*3/uL (ref 0.0–0.1)
Basophils Relative: 1 % (ref 0.0–3.0)
Eosinophils Absolute: 0.3 10*3/uL (ref 0.0–0.7)
Eosinophils Relative: 6.6 % — ABNORMAL HIGH (ref 0.0–5.0)
HCT: 46 % (ref 39.0–52.0)
Hemoglobin: 15.5 g/dL (ref 13.0–17.0)
Lymphocytes Relative: 33.9 % (ref 12.0–46.0)
Lymphs Abs: 1.8 10*3/uL (ref 0.7–4.0)
MCHC: 33.6 g/dL (ref 30.0–36.0)
MCV: 90.2 fl (ref 78.0–100.0)
Monocytes Absolute: 0.4 10*3/uL (ref 0.1–1.0)
Monocytes Relative: 7.9 % (ref 3.0–12.0)
Neutro Abs: 2.6 10*3/uL (ref 1.4–7.7)
Neutrophils Relative %: 50.6 % (ref 43.0–77.0)
Platelets: 192 10*3/uL (ref 150.0–400.0)
RBC: 5.1 Mil/uL (ref 4.22–5.81)
RDW: 13.4 % (ref 11.5–15.5)
WBC: 5.2 10*3/uL (ref 4.0–10.5)

## 2023-07-24 LAB — COMPREHENSIVE METABOLIC PANEL WITH GFR
ALT: 56 U/L — ABNORMAL HIGH (ref 0–53)
AST: 27 U/L (ref 0–37)
Albumin: 4.7 g/dL (ref 3.5–5.2)
Alkaline Phosphatase: 71 U/L (ref 39–117)
BUN: 15 mg/dL (ref 6–23)
CO2: 29 meq/L (ref 19–32)
Calcium: 9.9 mg/dL (ref 8.4–10.5)
Chloride: 103 meq/L (ref 96–112)
Creatinine, Ser: 1.25 mg/dL (ref 0.40–1.50)
GFR: 65.62 mL/min (ref >60.00)
Glucose, Bld: 106 mg/dL — ABNORMAL HIGH (ref 70–99)
Potassium: 4 meq/L (ref 3.5–5.1)
Sodium: 140 meq/L (ref 135–145)
Total Bilirubin: 0.8 mg/dL (ref 0.2–1.2)
Total Protein: 7.8 g/dL (ref 6.0–8.3)

## 2023-07-24 LAB — LIPID PANEL
Cholesterol: 282 mg/dL — ABNORMAL HIGH (ref 0–200)
HDL: 45.6 mg/dL (ref >39.00)
LDL Cholesterol: 203 mg/dL — ABNORMAL HIGH (ref 0–99)
NonHDL: 236.42
Total CHOL/HDL Ratio: 6
Triglycerides: 165 mg/dL — ABNORMAL HIGH (ref 0.0–149.0)
VLDL: 33 mg/dL (ref 0.0–40.0)

## 2023-07-24 LAB — MICROALBUMIN / CREATININE URINE RATIO
Creatinine,U: 226 mg/dL
Microalb Creat Ratio: 6.6 mg/g (ref 0.0–30.0)
Microalb, Ur: 1.5 mg/dL (ref 0.0–1.9)

## 2023-07-24 LAB — HEMOGLOBIN A1C: Hgb A1c MFr Bld: 6.1 % (ref 4.6–6.5)

## 2023-07-24 LAB — PSA: PSA: 0.92 ng/mL (ref 0.10–4.00)

## 2023-07-24 LAB — TSH: TSH: 1.8 u[IU]/mL (ref 0.35–5.50)

## 2023-07-24 MED ORDER — ROSUVASTATIN CALCIUM 10 MG PO TABS: 10.0000 mg | ORAL_TABLET | Freq: Every day | ORAL | 3 refills | Status: DC

## 2023-07-24 NOTE — Progress Notes (Signed)
 Assessment  Assessment/Plan:  Assessment and Plan Assessment & Plan Prediabetes A1c of 5.9 as of March 2020 with class II obesity (BMI 32.5). No current symptoms. Lifestyle modifications recommended to manage weight and reduce diabetes risk. - Recheck hemoglobin A1c - Screen for diabetes with comprehensive metabolic panel - Recommend lifestyle modifications including diet and exercise  Obesity Class II obesity (BMI 32.5). No current symptoms. Lifestyle modifications recommended to manage weight and reduce risk of associated conditions. - Recommend lifestyle modifications including 150 minutes of moderate-level activity per week and resistive training two times a week - Recommend a healthy diet like the Mediterranean diet with whole grains, fresh fruits and vegetables, healthy fats, and lean proteins  Hyperlipidemia Hyperlipidemia since March 2020. No current symptoms. Blood pressure well-controlled at 116/74. Class II obesity (BMI 32.5). Family history of high cholesterol in father. Screening for thyroid dysfunction and hematologic disorders recommended. - Recheck lipid panel - Screen for thyroid dysfunction with TSH - Screen for hematologic disorders  General Health Maintenance Due for routine screenings and vaccinations. Uncertain hepatitis B vaccination status. Due for hepatitis B, C, and HIV screening, colon and prostate cancer screening. Eye and dental exams recommended. Colonoscopy preferred for colon cancer screening due to biopsy and treatment capabilities, reducing mortality. Screening for renal and liver dysfunction recommended. - Screen for hepatitis B, C, and HIV - Screen for renal and liver dysfunction with comprehensive metabolic panel and urinalysis with microscopic and reflex culture - Screen GFR and check microalbumin creatinine ratio - Order PSA for prostate cancer screening - Refer for colonoscopy for colon cancer screening - Check antibody levels for hepatitis B  and vaccinate if necessary - Recommend annual eye exams - Recommend dental exam  Follow-up Plans for lab work and specialist referral discussed. - Perform lab work today - Referral for colonoscopy - Follow-up with lab results via MyChart     There are no discontinued medications.  Patient Counseling(The following topics were reviewed and/or handout was given):  -Nutrition: Stressed importance of moderation in sodium/caffeine intake, saturated fat and cholesterol, caloric balance, sufficient intake of fresh fruits, vegetables, and fiber.  -Stressed the importance of regular exercise.   -Substance Abuse: Discussed cessation/primary prevention of tobacco, alcohol, or other drug use; driving or other dangerous activities under the influence; availability of treatment for abuse.   -Injury prevention: Discussed safety belts, safety helmets, smoke detector, smoking near bedding or upholstery.   -Sexuality: Discussed sexually transmitted diseases, partner selection, use of condoms, avoidance of unintended pregnancy and contraceptive alternatives.   -Dental health: Discussed importance of regular tooth brushing, flossing, and dental visits.  -Health maintenance and immunizations reviewed. Please refer to Health maintenance section.  No follow-ups on file.        Subjective:   Encounter date: 07/24/2023  Chief Complaint  Patient presents with   Establish Care    Fasting     Discussed the use of AI scribe software for clinical note transcription with the patient, who gave verbal consent to proceed.  History of Present Illness Erik Porter is a 53 year old male with hyperlipidemia and prediabetes who presents for establishing care and fasting.  He has a history of hyperlipidemia and prediabetes diagnosed in March 2020. He manages these conditions by limiting sugar intake despite having a 'sweet tooth' and fasts daily by skipping breakfast. He does not consume coffee, soda,  alcohol, or smoke.  His family history includes a paternal grandfather who died of a heart attack at age 3 and a  father with high cholesterol and a stomach ulcer. There is no other significant family history of heart disease.  Socially, he was originally from [place], raised in [place], and has been living in [place] for 17 years. He moved to [place] because of his wife and has three children aged 75, 82, and 7. 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 He engages in physical activities such as badminton and plans to increase this once his sports club opens.  He recalls receiving multiple vaccinations in 2012 before traveling to [place] but is unsure if he received the hepatitis B vaccine. He last saw an eye doctor two years ago and is due for an eye exam this summer. He has not seen a dentist in the past six months.  No chest pain, shortness of breath, or other symptoms. He mentions snoring only when very tired and denies any witnessed apneas or breathing difficulties during sleep.       07/24/2023   10:03 AM 03/31/2018    3:25 PM  Depression screen PHQ 2/9  Decreased Interest  0  Down, Depressed, Hopeless 0 0  PHQ - 2 Score 0 0  Altered sleeping 0   Tired, decreased energy 1   Change in appetite 0   Feeling bad or failure about yourself  0   Trouble concentrating 0   Moving slowly or fidgety/restless 0   Suicidal thoughts 0   PHQ-9 Score 1   Difficult doing work/chores Not difficult at all         No data to display          Health Maintenance Due  Topic Date Due   HIV Screening  Never done   Hepatitis C Screening  Never done   Hepatitis B Vaccines (1 of 3 - 19+ 3-dose series) Never done   Colonoscopy  Never done       PMH:  The following were reviewed and entered/updated in epic: No past medical history on file.  Patient Active Problem List   Diagnosis Date Noted   Prediabetes  07/24/2023   Mixed hyperlipidemia 07/24/2023   Encounter to establish care with new provider 07/24/2023    No past surgical history on file.  Family History  Problem Relation Age of Onset   Hyperlipidemia Father    Peptic Ulcer Disease Sister    Heart attack Paternal Grandfather     Medications- reviewed and updated No outpatient medications prior to visit.   No facility-administered medications prior to visit.    Allergies  Allergen Reactions   Asa [Aspirin]    Penicillins     Social History   Socioeconomic History   Marital status: Married    Spouse name: Not on file   Number of children: 3   Years of education: Not on file   Highest education level: Not on file  Occupational History   Not on file  Tobacco Use   Smoking status: Never   Smokeless tobacco: Never  Vaping Use   Vaping status: Never Used  Substance and Sexual Activity   Alcohol use: No   Drug use: No   Sexual activity: Yes  Other Topics Concern   Not on file  Social History Narrative   Not on file   Social Drivers of Health   Financial Resource Strain: Not on file  Food Insecurity: Not on file  Transportation Needs: Not on file  Physical Activity: Not on file  Stress: Not on file  Social Connections: Not on file           Objective:  Physical Exam: BP 116/74   Pulse 67   Temp 97.9 F (36.6 C) (Temporal)   Ht 5' 6.25 (1.683 m)   Wt 203 lb 3.2 oz (92.2 kg)   SpO2 97%   BMI 32.55 kg/m   Body mass index is 32.55 kg/m. Wt Readings from Last 3 Encounters:  07/24/23 203 lb 3.2 oz (92.2 kg)  03/31/18 191 lb 3.2 oz (86.7 kg)  11/04/13 173 lb (78.5 kg)    Physical Exam VITALS: BP- 116/74 MEASUREMENTS: BMI- 32.5. GENERAL: Alert, cooperative, well developed, no acute distress. HEENT: Normocephalic, normal oropharynx, moist mucous membranes, right ear with cerumen not significant, left ear normal. CHEST: Clear to auscultation bilaterally, no wheezes, rhonchi, or  crackles. CARDIOVASCULAR: Normal heart rate and rhythm, S1 and S2 normal without murmurs. ABDOMEN: Soft, non-tender, non-distended, without organomegaly, normal bowel sounds. EXTREMITIES: No cyanosis or edema, no leg swelling. MUSCULOSKELETAL: Musculoskeletal system normal. NEUROLOGICAL: Cranial nerves grossly intact, moves all extremities without gross motor or sensory deficit.  Physical Exam Constitutional:      General: He is not in acute distress.    Appearance: Normal appearance. He is not ill-appearing or toxic-appearing.  HENT:     Head: Normocephalic and atraumatic.     Right Ear: Hearing, tympanic membrane, ear canal and external ear normal. There is no impacted cerumen.     Left Ear: Hearing, tympanic membrane, ear canal and external ear normal. There is no impacted cerumen.     Nose: Nose normal. No congestion.     Mouth/Throat:     Lips: No lesions.     Mouth: Mucous membranes are moist.     Pharynx: Oropharynx is clear. No oropharyngeal exudate.   Eyes:     General: No scleral icterus.       Right eye: No discharge.        Left eye: No discharge.     Conjunctiva/sclera: Conjunctivae normal.     Pupils: Pupils are equal, round, and reactive to light.   Neck:     Thyroid: No thyroid mass, thyromegaly or thyroid tenderness.   Cardiovascular:     Rate and Rhythm: Normal rate and regular rhythm.     Pulses: Normal pulses.     Heart sounds: Normal heart sounds.  Pulmonary:     Effort: Pulmonary effort is normal. No respiratory distress.     Breath sounds: Normal breath sounds.  Abdominal:     General: Abdomen is flat. Bowel sounds are normal.     Palpations: Abdomen is soft.   Musculoskeletal:        General: Normal range of motion.     Cervical back: Normal range of motion.     Right lower leg: No edema.     Left lower leg: No edema.  Lymphadenopathy:     Cervical: No cervical adenopathy.   Skin:    General: Skin is warm and dry.     Findings: No rash.    Neurological:     General: No focal deficit present.     Mental Status: He is alert and oriented to person, place, and time. Mental status is at baseline.     Deep Tendon Reflexes:     Reflex Scores:      Patellar reflexes are 2+ on the right side and 2+ on the left side.  Psychiatric:  Mood and Affect: Mood normal.        Behavior: Behavior normal.        Thought Content: Thought content normal.        Judgment: Judgment normal.         Prior labs:   No results found for this or any previous visit (from the past 2160 hours).  Lab Results  Component Value Date   CHOL 231 (H) 03/31/2018   Lab Results  Component Value Date   HDL 46 03/31/2018   Lab Results  Component Value Date   LDLCALC 153 (H) 03/31/2018   Lab Results  Component Value Date   TRIG 159 (H) 03/31/2018   Lab Results  Component Value Date   CHOLHDL 5.0 03/31/2018   No results found for: LDLDIRECT  Last metabolic panel Lab Results  Component Value Date   GLUCOSE 99 03/31/2018   NA 140 03/31/2018   K 4.0 03/31/2018   CL 102 03/31/2018   CO2 24 03/31/2018   BUN 14 03/31/2018   CREATININE 1.12 03/31/2018   GFRNONAA 77 03/31/2018   CALCIUM 9.8 03/31/2018    Lab Results  Component Value Date   HGBA1C 5.8 (H) 03/31/2018    Last CBC No results found for: WBC, HGB, HCT, MCV, MCH, RDW, PLT  Lab Results  Component Value Date   TSH 2.150 03/31/2018    No results found for: PSA1, PSA  Last vitamin D No results found for: 25OHVITD2, 25OHVITD3, VD25OH  No results found for: COLORU, CLARITYU, GLUCOSEUR, BILIRUBINUR, KETONESU, SPECGRAV, RBCUR, PHUR, PROTEINUR, UROBILINOGEN, LEUKOCYTESUR  No results found for: LABMICR, MICROALBUR   At today's visit, we discussed treatment options, associated risk and benefits, and engage in counseling as needed.  Additionally the following were reviewed: Past medical records, past medical and  surgical history, family and social background, as well as relevant laboratory results, imaging findings, and specialty notes, where applicable.  This message was generated using dictation software, and as a result, it may contain unintentional typos or errors.  Nevertheless, extensive effort was made to accurately convey at the pertinent aspects of the patient visit.    There may have been are other unrelated non-urgent complaints, but due to the busy schedule and the amount of time already spent with him, time does not permit to address these issues at today's visit. Another appointment may have or has been requested to review these additional issues.     Arvella Hummer, MD, MS

## 2023-07-25 DIAGNOSIS — R7401 Elevation of levels of liver transaminase levels: Secondary | ICD-10-CM | POA: Insufficient documentation

## 2023-07-25 DIAGNOSIS — R3121 Asymptomatic microscopic hematuria: Secondary | ICD-10-CM | POA: Insufficient documentation

## 2023-07-25 LAB — URINALYSIS W MICROSCOPIC + REFLEX CULTURE
Bacteria, UA: NONE SEEN /HPF
Bilirubin Urine: NEGATIVE
Glucose, UA: NEGATIVE
Hyaline Cast: NONE SEEN /LPF
Leukocyte Esterase: NEGATIVE
Nitrites, Initial: NEGATIVE
Protein, ur: NEGATIVE
Specific Gravity, Urine: 1.022 (ref 1.001–1.035)
Squamous Epithelial / HPF: NONE SEEN /HPF (ref <=5)
WBC, UA: NONE SEEN /HPF (ref 0–5)
pH: 5.5 (ref 5.0–8.0)

## 2023-07-25 LAB — HEPATITIS B SURFACE ANTIBODY,QUALITATIVE: Hep B S Ab: NONREACTIVE

## 2023-07-25 LAB — HEPATITIS B SURFACE ANTIGEN: Hepatitis B Surface Ag: NONREACTIVE

## 2023-07-25 LAB — HIV ANTIBODY (ROUTINE TESTING W REFLEX): HIV 1&2 Ab, 4th Generation: NONREACTIVE

## 2023-07-25 LAB — NO CULTURE INDICATED

## 2023-07-25 LAB — HEPATITIS C ANTIBODY: Hepatitis C Ab: NONREACTIVE

## 2023-07-25 LAB — HEPATITIS B CORE ANTIBODY, TOTAL: Hep B Core Total Ab: NONREACTIVE

## 2023-08-20 NOTE — Progress Notes (Incomplete)
    Chief Complaint: No chief complaint on file.   History of Present Illness:  Erik Porter is a 53 y.o. male who is seen in consultation from Sebastian Beverley NOVAK, MD for evaluation of microscopic hematuria.   Past Medical History:  No past medical history on file.  Past Surgical History:  No past surgical history on file.  Allergies:  Allergies  Allergen Reactions   Asa [Aspirin]    Penicillins     Family History:  Family History  Problem Relation Age of Onset   Hyperlipidemia Father    Peptic Ulcer Disease Sister    Heart attack Paternal Grandfather     Social History:  Social History   Tobacco Use   Smoking status: Never   Smokeless tobacco: Never  Vaping Use   Vaping status: Never Used  Substance Use Topics   Alcohol use: No   Drug use: No    Review of symptoms:  Constitutional:  Negative for unexplained weight loss, night sweats, fever, chills ENT:  Negative for nose bleeds, sinus pain, painful swallowing CV:  Negative for chest pain, shortness of breath, exercise intolerance, palpitations, loss of consciousness Resp:  Negative for cough, wheezing, shortness of breath GI:  Negative for nausea, vomiting, diarrhea, bloody stools GU:  Positives noted in HPI; otherwise negative for gross hematuria, dysuria, urinary incontinence Neuro:  Negative for seizures, poor balance, limb weakness, slurred speech Psych:  Negative for lack of energy, depression, anxiety Endocrine:  Negative for polydipsia, polyuria, symptoms of hypoglycemia (dizziness, hunger, sweating) Hematologic:  Negative for anemia, purpura, petechia, prolonged or excessive bleeding, use of anticoagulants  Allergic:  Negative for difficulty breathing or choking as a result of exposure to anything; no shellfish allergy; no allergic response (rash/itch) to materials, foods  Physical exam: There were no vitals taken for this visit. GENERAL APPEARANCE:  Well appearing, well developed, well  nourished, NAD HEENT: Atraumatic, Normocephalic. NECK: Normal appearance LUNGS: Normal inspiratory and expiratory excursion HEART: Regular Rate ABDOMEN: ***. GU: Phallus normal, no lesions. Scrotal skin normal. Testicles/epididymal structures normal. Meatus normal. Normal anal sphincter tone, prostate ***mL, symmetric, non nodular, non tender. EXTREMITIES: Moves all extremities well.  Without clubbing, cyanosis, or edema. NEUROLOGIC:  Alert and oriented x 3, normal gait, CN II-XII grossly intact.  MENTAL STATUS:  Appropriate. SKIN:  Warm, dry and intact.    Results: No results found for this or any previous visit (from the past 24 hours).  I have reviewed referring/prior physicians notes  I have reviewed urinalysis  I have reviewed PSA results  I have reviewed prior imaging  I have reviewed urine culture results  Assessment: ***   Plan: ***

## 2023-08-21 ENCOUNTER — Ambulatory Visit: Admitting: Urology

## 2023-08-21 DIAGNOSIS — R3129 Other microscopic hematuria: Secondary | ICD-10-CM

## 2023-08-21 NOTE — Progress Notes (Signed)
    Chief Complaint: Microscopic blood in urine  History of Present Illness:  Erik Porter is a 53 y.o. male who is seen in consultation from Sebastian Beverley NOVAK, MD for evaluation of microscopic hematuria.  He has no history of gross hematuria or lower urinary tract symptoms.  He denies history of tobacco use.  He has no history of high risk exposure to carcinogens.  Current IPSS 0/1.   Past Medical History:  No past medical history on file.  Past Surgical History:  No past surgical history on file.  Allergies:  Allergies  Allergen Reactions   Asa [Aspirin]    Penicillins     Family History:  Family History  Problem Relation Age of Onset   Hyperlipidemia Father    Peptic Ulcer Disease Sister    Heart attack Paternal Grandfather     Social History:  Social History   Tobacco Use   Smoking status: Never   Smokeless tobacco: Never  Vaping Use   Vaping status: Never Used  Substance Use Topics   Alcohol use: No   Drug use: No    Review of symptoms:  Constitutional:  Negative for unexplained weight loss, night sweats, fever, chills ENT:  Negative for nose bleeds, sinus pain, painful swallowing CV:  Negative for chest pain, shortness of breath, exercise intolerance, palpitations, loss of consciousness Resp:  Negative for cough, wheezing, shortness of breath GI:  Negative for nausea, vomiting, diarrhea, bloody stools GU:  Positives noted in HPI; otherwise negative for gross hematuria, dysuria, urinary incontinence Neuro:  Negative for seizures, poor balance, limb weakness, slurred speech Psych:  Negative for lack of energy, depression, anxiety Endocrine:  Negative for polydipsia, polyuria, symptoms of hypoglycemia (dizziness, hunger, sweating) Hematologic:  Negative for anemia, purpura, petechia, prolonged or excessive bleeding, use of anticoagulants  Allergic:  Negative for difficulty breathing or choking as a result of exposure to anything; no shellfish  allergy; no allergic response (rash/itch) to materials, foods  Physical exam: There were no vitals taken for this visit. GENERAL APPEARANCE:  Well appearing, well developed, well nourished, NAD HEENT: Atraumatic, Normocephalic. NECK: Normal appearance LUNGS: Normal inspiratory and expiratory excursion HEART: Regular Rate EXTREMITIES: Moves all extremities well.  Without clubbing, cyanosis, or edema. NEUROLOGIC:  Alert and oriented x 3, normal gait, CN II-XII grossly intact.  MENTAL STATUS:  Appropriate. SKIN:  Warm, dry and intact.    Results:  I have reviewed referring/prior physicians notes  I have reviewed urinalysis--clear today, microscopic exam from his urinalysis on 25 June revealed 3-10 red cells per microscopic high-power field  I have reviewed PSA results--0.92 on June 25  IPSS reviewed  Assessment: History of microscopic hematuria, not documented on urinalysis today.  No history of high risk exposure.   Plan: -Patient reassured about urinalysis today  -I will have him drop by in a month for urine drop-off.  If positive, proceed with hematuria evaluation.  If not, recheck in a year

## 2023-08-26 ENCOUNTER — Encounter: Payer: Self-pay | Admitting: Urology

## 2023-08-26 ENCOUNTER — Ambulatory Visit (INDEPENDENT_AMBULATORY_CARE_PROVIDER_SITE_OTHER): Admitting: Urology

## 2023-08-26 VITALS — BP 156/64 | HR 116 | Ht 66.0 in | Wt 207.0 lb

## 2023-08-26 DIAGNOSIS — R3121 Asymptomatic microscopic hematuria: Secondary | ICD-10-CM

## 2023-08-26 DIAGNOSIS — Z87448 Personal history of other diseases of urinary system: Secondary | ICD-10-CM

## 2023-08-26 LAB — URINALYSIS, ROUTINE W REFLEX MICROSCOPIC
Bilirubin, UA: NEGATIVE
Glucose, UA: NEGATIVE
Ketones, UA: NEGATIVE
Leukocytes,UA: NEGATIVE
Nitrite, UA: NEGATIVE
Protein,UA: NEGATIVE
RBC, UA: NEGATIVE
Specific Gravity, UA: 1.02 (ref 1.005–1.030)
Urobilinogen, Ur: 0.2 mg/dL (ref 0.2–1.0)
pH, UA: 5.5 (ref 5.0–7.5)

## 2023-08-28 ENCOUNTER — Ambulatory Visit: Admitting: Urology

## 2023-09-23 ENCOUNTER — Other Ambulatory Visit

## 2023-09-23 DIAGNOSIS — R3121 Asymptomatic microscopic hematuria: Secondary | ICD-10-CM

## 2023-09-23 LAB — URINALYSIS, ROUTINE W REFLEX MICROSCOPIC
Bilirubin, UA: NEGATIVE
Glucose, UA: NEGATIVE
Ketones, UA: NEGATIVE
Leukocytes,UA: NEGATIVE
Nitrite, UA: NEGATIVE
Protein,UA: NEGATIVE
RBC, UA: NEGATIVE
Specific Gravity, UA: 1.02 (ref 1.005–1.030)
Urobilinogen, Ur: 0.2 mg/dL (ref 0.2–1.0)
pH, UA: 5.5 (ref 5.0–7.5)

## 2023-09-24 ENCOUNTER — Ambulatory Visit: Payer: Self-pay

## 2023-09-27 ENCOUNTER — Encounter: Payer: Self-pay | Admitting: Family Medicine

## 2023-10-03 ENCOUNTER — Encounter: Payer: Self-pay | Admitting: Gastroenterology

## 2023-10-11 ENCOUNTER — Other Ambulatory Visit: Payer: Self-pay | Admitting: Medical Genetics

## 2023-10-18 ENCOUNTER — Encounter

## 2023-10-18 ENCOUNTER — Telehealth: Payer: Self-pay

## 2023-10-18 NOTE — Telephone Encounter (Signed)
 RN logged into video visit/pre-visit appointment (scheduled for 1100)and stayed logged into appointment until 1110 am, patient was a no-show for video visit appointment.  If patient does not reschedule his previsit appointment by 5:00 pm today, his colonoscopy will be cancelled.  A no-show letter will be sent to the patient.

## 2023-10-22 ENCOUNTER — Ambulatory Visit (AMBULATORY_SURGERY_CENTER)

## 2023-10-22 ENCOUNTER — Other Ambulatory Visit: Payer: Self-pay

## 2023-10-22 VITALS — Ht 66.0 in | Wt 205.0 lb

## 2023-10-22 DIAGNOSIS — Z1211 Encounter for screening for malignant neoplasm of colon: Secondary | ICD-10-CM

## 2023-10-22 MED ORDER — PEG 3350-KCL-NA BICARB-NACL 420 G PO SOLR: 4000.0000 mL | Freq: Once | ORAL | 0 refills | Status: AC

## 2023-10-22 NOTE — Progress Notes (Signed)
 Denies allergies to eggs or soy products. Denies complication of anesthesia or sedation. Denies use of weight loss medication. Denies use of O2.   Emmi instructions given for colonoscopy.

## 2023-11-01 ENCOUNTER — Ambulatory Visit
Admission: EM | Admit: 2023-11-01 | Discharge: 2023-11-01 | Disposition: A | Discharge status: Home / Self Care | Attending: Family Medicine | Admitting: Family Medicine

## 2023-11-01 ENCOUNTER — Other Ambulatory Visit: Payer: Self-pay

## 2023-11-01 ENCOUNTER — Encounter: Admitting: Gastroenterology

## 2023-11-01 ENCOUNTER — Ambulatory Visit (INDEPENDENT_AMBULATORY_CARE_PROVIDER_SITE_OTHER): Admitting: Radiology

## 2023-11-01 DIAGNOSIS — K5900 Constipation, unspecified: Secondary | ICD-10-CM

## 2023-11-01 DIAGNOSIS — M545 Low back pain, unspecified: Secondary | ICD-10-CM | POA: Diagnosis not present

## 2023-11-01 DIAGNOSIS — N200 Calculus of kidney: Secondary | ICD-10-CM

## 2023-11-01 LAB — POCT URINE DIPSTICK
Bilirubin, UA: NEGATIVE
Glucose, UA: NEGATIVE mg/dL
Ketones, POC UA: NEGATIVE mg/dL
Leukocytes, UA: NEGATIVE
Nitrite, UA: NEGATIVE
Spec Grav, UA: >=1.03 — AB (ref 1.010–1.025)
Urobilinogen, UA: 0.2 U/dL
pH, UA: 5.5 (ref 5.0–8.0)

## 2023-11-01 MED ORDER — KETOROLAC TROMETHAMINE 30 MG/ML IJ SOLN
30.0000 mg | Freq: Once | INTRAMUSCULAR | Status: AC
  Administered 2023-11-01: 30 mg via INTRAMUSCULAR

## 2023-11-01 MED ORDER — TAMSULOSIN HCL 0.4 MG PO CAPS: 0.4000 mg | ORAL_CAPSULE | Freq: Every day | ORAL | 0 refills | Status: DC

## 2023-11-01 NOTE — ED Triage Notes (Addendum)
 Pt presents with complaints of intermittent right lower back pain and constipation. Last BM was 2-3 days ago. Describes the back pain as sharp. Currently rates as a 9/10. OTC TUMS + Tylenol taken with minimal relief. Pt shares his abdomen feels like it is contracting when the back pain comes.

## 2023-11-01 NOTE — ED Provider Notes (Signed)
 [facility]    CSN: [id number] Arrival date & time: 11/01/23  0802      History   Chief Complaint Chief Complaint  Patient presents with   Back Pain   Constipation    HPI Erik Porter is a 53 y.o. male.    Back Pain Constipation Associated symptoms: back pain    Patient is here for sharp pain to the right lower back, and felt constipated.  He took 2 tums with relief.  The pain restarted last evening, and has stayed.  Currently pain is only at the right lower back.  He feels the right lower abdomen is trembling but no pain.  No heavy lifting or injury.  No n/v, but was burping a lot.  Still with constipation.  No urinary symptoms noted.  No fevers/chills.  At this time pain is about 3/10, but comes and goes in intensity.   Pain is random, comes/goes.       History reviewed. No pertinent past medical history.  Patient Active Problem List   Diagnosis Date Noted   Asymptomatic microscopic hematuria 07/25/2023   Transaminasemia 07/25/2023   Prediabetes 07/24/2023   Mixed hyperlipidemia 07/24/2023   Encounter to establish care with new provider 07/24/2023    History reviewed. No pertinent surgical history.     Home Medications    Prior to Admission medications   Medication Sig Start Date End Date Taking? Authorizing Provider  Multiple Vitamin (MULTIVITAMIN) tablet Take 1 tablet by mouth daily.    [provider]  rosuvastatin  (CRESTOR ) 10 MG tablet Take 1 tablet (10 mg total) by mouth daily. Patient not taking: Reported on 08/26/2023 07/24/23   Sebastian Beverley NOVAK, MD    Family History Family History  Problem Relation Age of Onset   Hyperlipidemia Father    Peptic Ulcer Disease Sister    Heart attack Paternal Grandfather    Colon cancer Neg Hx    Esophageal cancer Neg Hx    Stomach cancer Neg Hx    Rectal cancer Neg Hx     Social History Social History   Tobacco Use   Smoking status: Never   Smokeless tobacco: Never   Vaping Use   Vaping status: Never Used  Substance Use Topics   Alcohol use: No   Drug use: No     Allergies   Asa [aspirin] and Penicillins   Review of Systems Review of Systems  Constitutional: Negative.   HENT: Negative.    Respiratory: Negative.    Cardiovascular: Negative.   Gastrointestinal:  Positive for constipation.  Musculoskeletal:  Positive for back pain.  Skin: Negative.   Psychiatric/Behavioral: Negative.       Physical Exam Triage Vital Signs ED Triage Vitals  Encounter Vitals Group     BP 11/01/23 0816 118/78     Girls Systolic BP Percentile --      Girls Diastolic BP Percentile --      Boys Systolic BP Percentile --      Boys Diastolic BP Percentile --      Pulse Rate 11/01/23 0816 82     Resp 11/01/23 0816 18     Temp 11/01/23 0816 98 F (36.7 C)     Temp Source 11/01/23 0816 Oral     SpO2 11/01/23 0816 94 %     Weight 11/01/23 0815 205 lb 0.4 oz (93 kg)     Height 11/01/23 0815 5' 6 (1.676 m)     Head Circumference --  Peak Flow --      Pain Score 11/01/23 0814 9     Pain Loc --      Pain Education --      Exclude from Growth Chart --    No data found.  Updated Vital Signs BP 118/78 (BP Location: Left Arm)   Pulse 82   Temp 98 F (36.7 C) (Oral)   Resp 18   Ht 5' 6 (1.676 m)   Wt 93 kg   SpO2 95%   BMI 33.09 kg/m   Visual Acuity Right Eye Distance:   Left Eye Distance:   Bilateral Distance:    Right Eye Near:   Left Eye Near:    Bilateral Near:     Physical Exam Constitutional:      General: He is not in acute distress.    Appearance: Normal appearance. He is normal weight. He is not ill-appearing or toxic-appearing.     Comments: Sitting comfortably  Cardiovascular:     Rate and Rhythm: Normal rate and regular rhythm.  Pulmonary:     Effort: Pulmonary effort is normal.     Breath sounds: Normal breath sounds.  Abdominal:     Palpations: Abdomen is soft.     Tenderness: There is no abdominal tenderness.  There is no right CVA tenderness, left CVA tenderness, guarding or rebound.  Musculoskeletal:     Comments: TTP to the right SI joint;  No spinous tenderness;  no other tenderness noted;  pain to the low back with movement.   Skin:    General: Skin is warm.  Neurological:     General: No focal deficit present.     Mental Status: He is alert.  Psychiatric:        Mood and Affect: Mood normal.      UC Treatments / Results  Labs (all labs ordered are listed, but only abnormal results are displayed) Labs Reviewed  POCT URINE DIPSTICK - Abnormal; Notable for the following components:      Result Value   Spec Grav, UA >=1.030 (*)    Blood, UA trace-intact (*)    Protein Ur, POC trace (*)    All other components within normal limits    EKG   Radiology DG Abd 1 View Result Date: 11/01/2023 CLINICAL DATA:  Right lower back pain and constipation. No bowel movement 2-3 days. EXAM: ABDOMEN - 1 VIEW COMPARISON:  None Available. FINDINGS: Bowel gas pattern is nonobstructive with mild to moderate fecal retention of the right colon. No dilated bowel loops or free intraperitoneal air. 6 mm calcific density projects over the right abdomen between the L2 and L3 transverse processes which may represent a proximal ureteral stone. Several calcifications of the pelvis compatible phleboliths. Minimal degenerative change of the lower lumbar spine. IMPRESSION: 1. Nonobstructive bowel gas pattern with mild to moderate fecal retention of the right colon. 2. 6 mm calcific density projecting over the right abdomen which may represent a proximal ureteral stone. Electronically Signed   By: Toribio Agreste M.D.   On: 11/01/2023 08:42    Procedures Procedures (including critical care time)  Medications Ordered in UC Medications  ketorolac (TORADOL) 30 MG/ML injection 30 mg (has no administration in time range)    Initial Impression / Assessment and Plan / UC Course  I have reviewed the triage vital signs and  the nursing notes.  Pertinent labs & imaging results that were available during my care of the patient were reviewed by me and considered  in my medical decision making (see chart for details).   Final Clinical Impressions(s) / UC Diagnoses   Final diagnoses:  Acute right-sided low back pain without sciatica  Constipation, unspecified constipation type  Kidney stone     Discharge Instructions      You were seen today for constipation and back pain.  I recommend you take mirilax over the counter for constipation.   For your back, this may be due to a kidney stone.  I have sent out a medication to take daily to help pass this, and I recommend you take motrin /advil  for pain as well.  If you have worsening pain, or develop fever, chills, nausea, vomiting then please go to the ER for further treatment.     ED Prescriptions     Medication Sig Dispense Auth. Provider   tamsulosin (FLOMAX) 0.4 MG CAPS capsule Take 1 capsule (0.4 mg total) by mouth daily. 10 capsule Darral Longs, MD      PDMP not reviewed this encounter.   Darral Longs, MD 11/01/23 [phone]

## 2023-11-01 NOTE — Discharge Instructions (Signed)
 You were seen today for constipation and back pain.  I recommend you take mirilax over the counter for constipation.   For your back, this may be due to a kidney stone.  I have sent out a medication to take daily to help pass this, and I recommend you take motrin /advil  for pain as well.  If you have worsening pain, or develop fever, chills, nausea, vomiting then please go to the ER for further treatment.

## 2023-11-05 DIAGNOSIS — H524 Presbyopia: Secondary | ICD-10-CM | POA: Diagnosis not present

## 2023-11-28 ENCOUNTER — Ambulatory Visit: Admitting: Gastroenterology

## 2023-11-28 ENCOUNTER — Encounter: Payer: Self-pay | Admitting: Gastroenterology

## 2023-11-28 VITALS — BP 106/74 | HR 68 | Temp 98.1°F | Resp 13 | Ht 66.0 in | Wt 205.0 lb

## 2023-11-28 DIAGNOSIS — K644 Residual hemorrhoidal skin tags: Secondary | ICD-10-CM

## 2023-11-28 DIAGNOSIS — K635 Polyp of colon: Secondary | ICD-10-CM

## 2023-11-28 DIAGNOSIS — D120 Benign neoplasm of cecum: Secondary | ICD-10-CM

## 2023-11-28 DIAGNOSIS — Z1211 Encounter for screening for malignant neoplasm of colon: Secondary | ICD-10-CM | POA: Diagnosis not present

## 2023-11-28 DIAGNOSIS — K648 Other hemorrhoids: Secondary | ICD-10-CM | POA: Diagnosis not present

## 2023-11-28 MED ORDER — SODIUM CHLORIDE 0.9 % IV SOLN: 500.0000 mL | Freq: Once | INTRAVENOUS | Status: DC

## 2023-11-28 NOTE — Op Note (Signed)
 [facility] Patient Name: Erik Porter Procedure Date: 11/28/2023 11:38 AM MRN: [id number] Endoscopist: Gustav ALONSO Mcgee , MD, [id number] Age: 53 Referring MD:  Date of Birth: 03/08/1970 Gender: Male Account #: [id number] Procedure:                Colonoscopy Indications:              Screening for colorectal malignant neoplasm Medicines:                Monitored Anesthesia Care Procedure:                Pre-Anesthesia Assessment:                           - Prior to the procedure, a History and Physical                            was performed, and patient medications and                            allergies were reviewed. The patient's tolerance of                            previous anesthesia was also reviewed. The risks                            and benefits of the procedure and the sedation                            options and risks were discussed with the patient.                            All questions were answered, and informed consent                            was obtained. Prior Anticoagulants: The patient has                            taken no anticoagulant or antiplatelet agents. ASA                            Grade Assessment: II - A patient with mild systemic                            disease. After reviewing the risks and benefits,                            the patient was deemed in satisfactory condition to                            undergo the procedure.                           After obtaining informed consent, the colonoscope  was passed under direct vision. Throughout the                            procedure, the patient's blood pressure, pulse, and                            oxygen saturations were monitored continuously. The                            [id number]-HQ190L Colonoscope [id number] was introduced                            through the anus and advanced to the the cecum,                             identified by appendiceal orifice and ileocecal                            valve. The colonoscopy was performed without                            difficulty. The patient tolerated the procedure                            well. The quality of the bowel preparation was                            good. The ileocecal valve, appendiceal orifice, and                            rectum were photographed. Scope In: 11:43:17 AM Scope Out: 11:55:23 AM Scope Withdrawal Time: 0 hours 8 minutes 8 seconds  Total Procedure Duration: 0 hours 12 minutes 6 seconds  Findings:                 The perianal and digital rectal examinations were                            normal.                           A 1 mm polyp was found in the cecum. The polyp was                            sessile. The polyp was removed with a cold biopsy                            forceps. Resection and retrieval were complete.                           Non-bleeding external and internal hemorrhoids were                            found during retroflexion. The hemorrhoids were  small. Complications:            No immediate complications. Estimated Blood Loss:     Estimated blood loss was minimal. Impression:               - One 1 mm polyp in the cecum, removed with a cold                            biopsy forceps. Resected and retrieved.                           - Non-bleeding external and internal hemorrhoids. Recommendation:           - Resume previous diet.                           - Continue present medications.                           - Await pathology results.                           - Repeat colonoscopy in 7-10 years for surveillance                            based on pathology results. Elia Nunley V. Odysseus Cada, MD 11/28/2023 12:03:52 PM This report has been signed electronically.

## 2023-11-28 NOTE — Progress Notes (Signed)
 [facility] History and Physical   Primary Care Physician:  Sebastian Beverley NOVAK, MD   Reason for Procedure:  Colorectal cancer screening  Plan:    Screening colonoscopy with possible interventions as needed     HPI: Erik Porter is a very pleasant 53 y.o. male here for screening colonoscopy. Denies any nausea, vomiting, abdominal pain, melena or bright red blood per rectum  The risks and benefits as well as alternatives of endoscopic procedure(s) have been discussed and reviewed.  The patient was provided an opportunity to ask questions and all were answered. The patient agreed with the plan and demonstrated an understanding of the instructions.   History reviewed. No pertinent past medical history.  History reviewed. No pertinent surgical history.  Prior to Admission medications   Medication Sig Start Date End Date Taking? Authorizing Provider  Multiple Vitamin (MULTIVITAMIN) tablet Take 1 tablet by mouth daily.   Yes [provider]    Current Outpatient Medications  Medication Sig Dispense Refill   Multiple Vitamin (MULTIVITAMIN) tablet Take 1 tablet by mouth daily.     Current Facility-Administered Medications  Medication Dose Route Frequency Provider Last Rate Last Admin   0.9 %  sodium chloride infusion  500 mL Intravenous Once Shaymus Eveleth V, MD        Allergies as of 11/28/2023 - Review Complete 11/28/2023  Allergen Reaction Noted   Asa [aspirin] Other (See Comments) 12/06/2012   Penicillins Other (See Comments) 12/06/2012    Family History  Problem Relation Age of Onset   Hyperlipidemia Father    Peptic Ulcer Disease Sister    Heart attack Paternal Grandfather    Colon cancer Neg Hx    Esophageal cancer Neg Hx    Stomach cancer Neg Hx    Rectal cancer Neg Hx     Social History   Socioeconomic History   Marital status: Married    Spouse name: Not on file   Number of children: 3   Years of education: Not on file    Highest education level: Not on file  Occupational History   Not on file  Tobacco Use   Smoking status: Never   Smokeless tobacco: Never  Vaping Use   Vaping status: Never Used  Substance and Sexual Activity   Alcohol use: No   Drug use: No   Sexual activity: Yes  Other Topics Concern   Not on file  Social History Narrative   Not on file   Social Drivers of Health   Financial Resource Strain: Not on file  Food Insecurity: Not on file  Transportation Needs: Not on file  Physical Activity: Not on file  Stress: Not on file  Social Connections: Not on file  Intimate Partner Violence: Not on file    Review of Systems:  All other review of systems negative except as mentioned in the HPI.  Physical Exam: Vital signs in last 24 hours: BP 132/76   Pulse 68   Temp 98.1 F (36.7 C) (Temporal)   Ht 5' 6 (1.676 m)   Wt 205 lb (93 kg)   SpO2 97%   BMI 33.09 kg/m  General:   Alert, NAD Lungs:  Clear .   Heart:  Regular rate and rhythm Abdomen:  Soft, nontender and nondistended. Neuro/Psych:  Alert and cooperative. Normal mood and affect. A and O x 3  Reviewed labs, radiology imaging, old records and pertinent past GI work up  Patient is appropriate for planned procedure(s) and anesthesia in an ambulatory  setting   K. Veena Noe Goyer , MD [phone]

## 2023-11-28 NOTE — Progress Notes (Signed)
 Pt's states no medical or surgical changes since previsit or office visit.

## 2023-11-28 NOTE — Patient Instructions (Signed)
 YOU HAD AN ENDOSCOPIC PROCEDURE TODAY AT THE [facility]:   Refer to the procedure report that was given to you for any specific questions about what was found during the examination.  If the procedure report does not answer your questions, please call your gastroenterologist to clarify.  If you requested that your care partner not be given the details of your procedure findings, then the procedure report has been included in a sealed envelope for you to review at your convenience later.  YOU SHOULD EXPECT: Some feelings of bloating in the abdomen. Passage of more gas than usual.  Walking can help get rid of the air that was put into your GI tract during the procedure and reduce the bloating. If you had a lower endoscopy (such as a colonoscopy or flexible sigmoidoscopy) you may notice spotting of blood in your stool or on the toilet paper. If you underwent a bowel prep for your procedure, you may not have a normal bowel movement for a few days.  Please Note:  You might notice some irritation and congestion in your nose or some drainage.  This is from the oxygen used during your procedure.  There is no need for concern and it should clear up in a day or so.  SYMPTOMS TO REPORT IMMEDIATELY:  Following lower endoscopy (colonoscopy or flexible sigmoidoscopy):  Excessive amounts of blood in the stool  Significant tenderness or worsening of abdominal pains  Swelling of the abdomen that is new, acute  Fever of 100F or higher  Resume previous diet Continue present medications Await pathology results Handouts on polyps and hemorrhoids given   For urgent or emergent issues, a gastroenterologist can be reached at any hour by calling [phone]. Do not use MyChart messaging for urgent concerns.    DIET:  We do recommend a small meal at first, but then you may proceed to your regular diet.  Drink plenty of fluids but you should avoid alcoholic beverages for 24 hours.  ACTIVITY:  You  should plan to take it easy for the rest of today and you should NOT DRIVE or use heavy machinery until tomorrow (because of the sedation medicines used during the test).    FOLLOW UP: Our staff will call the number listed on your records the next business day following your procedure.  We will call around 7:15- 8:00 am to check on you and address any questions or concerns that you may have regarding the information given to you following your procedure. If we do not reach you, we will leave a message.     If any biopsies were taken you will be contacted by phone or by letter within the next 1-3 weeks.  Please call us  at [phone] if you have not heard about the biopsies in 3 weeks.    SIGNATURES/CONFIDENTIALITY: You and/or your care partner have signed paperwork which will be entered into your electronic medical record.  These signatures attest to the fact that that the information above on your After Visit Summary has been reviewed and is understood.  Full responsibility of the confidentiality of this discharge information lies with you and/or your care-partner.

## 2023-11-28 NOTE — Progress Notes (Signed)
 Called to room to assist during endoscopic procedure.  Patient ID and intended procedure confirmed with present staff. Received instructions for my participation in the procedure from the performing physician.

## 2023-11-28 NOTE — Progress Notes (Signed)
 Vss nad trans to pacu

## 2023-11-29 ENCOUNTER — Telehealth: Payer: Self-pay | Admitting: Lactation Services

## 2023-11-29 NOTE — Telephone Encounter (Signed)
 No answer left voice mail

## 2023-12-02 LAB — SURGICAL PATHOLOGY

## 2023-12-11 ENCOUNTER — Ambulatory Visit: Payer: Self-pay | Admitting: Gastroenterology

## 2023-12-30 ENCOUNTER — Other Ambulatory Visit (HOSPITAL_COMMUNITY)
Admission: RE | Admit: 2023-12-30 | Discharge: 2023-12-30 | Disposition: A | Payer: Self-pay | Source: Ambulatory Visit | Attending: Medical Genetics | Admitting: Medical Genetics

## 2024-01-08 LAB — GENECONNECT MOLECULAR SCREEN: Genetic Analysis Overall Interpretation: NEGATIVE

## 2024-07-24 ENCOUNTER — Encounter: Admitting: Family Medicine
